# Patient Record
Sex: Male | Born: 2000 | Race: Black or African American | Hispanic: No | Marital: Single | Smoking: Never smoker
Health system: Southern US, Community
[De-identification: ages and names within clinical notes are randomized; demographics above are authoritative.]

## PROBLEM LIST (undated history)

## (undated) DIAGNOSIS — I10 Essential (primary) hypertension: Secondary | ICD-10-CM

## (undated) DIAGNOSIS — F419 Anxiety disorder, unspecified: Secondary | ICD-10-CM

## (undated) HISTORY — DX: Anxiety disorder, unspecified: F41.9

## (undated) HISTORY — DX: Essential (primary) hypertension: I10

---

## 2019-07-13 ENCOUNTER — Emergency Department (HOSPITAL_COMMUNITY): Payer: No Typology Code available for payment source

## 2019-07-13 ENCOUNTER — Inpatient Hospital Stay (HOSPITAL_COMMUNITY): Payer: No Typology Code available for payment source

## 2019-07-13 ENCOUNTER — Other Ambulatory Visit: Payer: Self-pay

## 2019-07-13 ENCOUNTER — Inpatient Hospital Stay (HOSPITAL_COMMUNITY)
Admission: EM | Admit: 2019-07-13 | Discharge: 2019-07-16 | DRG: 982 | Disposition: A | Discharge status: Home / Self Care | Payer: No Typology Code available for payment source | Attending: Physician Assistant | Admitting: Physician Assistant

## 2019-07-13 DIAGNOSIS — S71111A Laceration without foreign body, right thigh, initial encounter: Secondary | ICD-10-CM | POA: Diagnosis present

## 2019-07-13 DIAGNOSIS — R52 Pain, unspecified: Secondary | ICD-10-CM

## 2019-07-13 DIAGNOSIS — S81011A Laceration without foreign body, right knee, initial encounter: Secondary | ICD-10-CM | POA: Diagnosis present

## 2019-07-13 DIAGNOSIS — M264 Malocclusion, unspecified: Secondary | ICD-10-CM | POA: Diagnosis present

## 2019-07-13 DIAGNOSIS — Z20822 Contact with and (suspected) exposure to covid-19: Secondary | ICD-10-CM | POA: Diagnosis present

## 2019-07-13 DIAGNOSIS — Y92410 Unspecified street and highway as the place of occurrence of the external cause: Secondary | ICD-10-CM

## 2019-07-13 DIAGNOSIS — S02621A Fracture of subcondylar process of right mandible, initial encounter for closed fracture: Secondary | ICD-10-CM | POA: Diagnosis present

## 2019-07-13 DIAGNOSIS — S02611A Fracture of condylar process of right mandible, initial encounter for closed fracture: Secondary | ICD-10-CM

## 2019-07-13 DIAGNOSIS — S270XXA Traumatic pneumothorax, initial encounter: Principal | ICD-10-CM | POA: Diagnosis present

## 2019-07-13 DIAGNOSIS — J939 Pneumothorax, unspecified: Secondary | ICD-10-CM

## 2019-07-13 LAB — COMPREHENSIVE METABOLIC PANEL
ALT: 117 U/L — ABNORMAL HIGH (ref 0–44)
AST: 96 U/L — ABNORMAL HIGH (ref 15–41)
Albumin: 4.3 g/dL (ref 3.5–5.0)
Alkaline Phosphatase: 36 U/L — ABNORMAL LOW (ref 38–126)
Anion gap: 11 (ref 5–15)
BUN: 13 mg/dL (ref 6–20)
CO2: 25 mmol/L (ref 22–32)
Calcium: 9.6 mg/dL (ref 8.9–10.3)
Chloride: 104 mmol/L (ref 98–111)
Creatinine, Ser: 1.3 mg/dL — ABNORMAL HIGH (ref 0.61–1.24)
GFR calc Af Amer: >60 mL/min (ref >60)
GFR calc non Af Amer: >60 mL/min (ref >60)
Glucose, Bld: 151 mg/dL — ABNORMAL HIGH (ref 70–99)
Potassium: 4.1 mmol/L (ref 3.5–5.1)
Sodium: 140 mmol/L (ref 135–145)
Total Bilirubin: 1.9 mg/dL — ABNORMAL HIGH (ref 0.3–1.2)
Total Protein: 7.1 g/dL (ref 6.5–8.1)

## 2019-07-13 LAB — RESPIRATORY PANEL BY RT PCR (FLU A&B, COVID)
Influenza A by PCR: NEGATIVE
Influenza B by PCR: NEGATIVE
SARS Coronavirus 2 by RT PCR: NEGATIVE

## 2019-07-13 LAB — I-STAT CHEM 8, ED
BUN: 16 mg/dL (ref 6–20)
Calcium, Ion: 1.24 mmol/L (ref 1.15–1.40)
Chloride: 101 mmol/L (ref 98–111)
Creatinine, Ser: 1.2 mg/dL (ref 0.61–1.24)
Glucose, Bld: 153 mg/dL — ABNORMAL HIGH (ref 70–99)
HCT: 46 % (ref 39.0–52.0)
Hemoglobin: 15.6 g/dL (ref 13.0–17.0)
Potassium: 3.8 mmol/L (ref 3.5–5.1)
Sodium: 138 mmol/L (ref 135–145)
TCO2: 29 mmol/L (ref 22–32)

## 2019-07-13 LAB — CBC
HCT: 46.8 % (ref 39.0–52.0)
Hemoglobin: 15.1 g/dL (ref 13.0–17.0)
MCH: 29.4 pg (ref 26.0–34.0)
MCHC: 32.3 g/dL (ref 30.0–36.0)
MCV: 91.2 fL (ref 80.0–100.0)
Platelets: 187 10*3/uL (ref 150–400)
RBC: 5.13 MIL/uL (ref 4.22–5.81)
RDW: 12.4 % (ref 11.5–15.5)
WBC: 18.9 10*3/uL — ABNORMAL HIGH (ref 4.0–10.5)
nRBC: 0 % (ref 0.0–0.2)

## 2019-07-13 LAB — ETHANOL: Alcohol, Ethyl (B): <10 mg/dL (ref <10)

## 2019-07-13 LAB — PROTIME-INR
INR: 1.1 (ref 0.8–1.2)
Prothrombin Time: 13.7 seconds (ref 11.4–15.2)

## 2019-07-13 LAB — SAMPLE TO BLOOD BANK

## 2019-07-13 LAB — LACTIC ACID, PLASMA
Lactic Acid, Venous: 3.4 mmol/L (ref 0.5–1.9)
Lactic Acid, Venous: 2.1 mmol/L (ref 0.5–1.9)

## 2019-07-13 MED ORDER — LIDOCAINE 2% (20 MG/ML) 5 ML SYRINGE
INTRAMUSCULAR | Status: AC
  Filled 2019-07-13: qty 5

## 2019-07-13 MED ORDER — IBUPROFEN 200 MG PO TABS: 600.0000 mg | ORAL_TABLET | Freq: Four times a day (QID) | ORAL | Status: DC | PRN

## 2019-07-13 MED ORDER — SODIUM CHLORIDE 0.9 % IV BOLUS: 1000.0000 mL | Freq: Once | INTRAVENOUS | Status: DC

## 2019-07-13 MED ORDER — SUCCINYLCHOLINE CHLORIDE 200 MG/10ML IV SOSY
PREFILLED_SYRINGE | INTRAVENOUS | Status: AC
  Filled 2019-07-13: qty 10

## 2019-07-13 MED ORDER — ROCURONIUM BROMIDE 10 MG/ML (PF) SYRINGE
PREFILLED_SYRINGE | INTRAVENOUS | Status: AC
  Filled 2019-07-13: qty 10

## 2019-07-13 MED ORDER — FENTANYL CITRATE (PF) 250 MCG/5ML IJ SOLN
INTRAMUSCULAR | Status: AC
  Filled 2019-07-13: qty 5

## 2019-07-13 MED ORDER — MIDAZOLAM HCL 2 MG/2ML IJ SOLN
INTRAMUSCULAR | Status: AC
  Filled 2019-07-13: qty 2

## 2019-07-13 MED ORDER — ONDANSETRON 4 MG PO TBDP
4.0000 mg | ORAL_TABLET | Freq: Four times a day (QID) | ORAL | Status: DC | PRN
  Administered 2019-07-14: 4 mg via ORAL
  Filled 2019-07-13: qty 1

## 2019-07-13 MED ORDER — BACITRACIN ZINC 500 UNIT/GM EX OINT
TOPICAL_OINTMENT | Freq: Once | CUTANEOUS | Status: AC
  Administered 2019-07-13: 1 via TOPICAL

## 2019-07-13 MED ORDER — HYDROMORPHONE HCL 1 MG/ML IJ SOLN
0.5000 mg | INTRAMUSCULAR | Status: DC | PRN
  Administered 2019-07-13 – 2019-07-14 (×2): 0.5 mg via INTRAVENOUS
  Filled 2019-07-13 (×2): qty 1

## 2019-07-13 MED ORDER — DOCUSATE SODIUM 100 MG PO CAPS
200.0000 mg | ORAL_CAPSULE | Freq: Two times a day (BID) | ORAL | Status: DC
  Administered 2019-07-13: 200 mg via ORAL
  Filled 2019-07-13: qty 2

## 2019-07-13 MED ORDER — LIDOCAINE-EPINEPHRINE (PF) 2 %-1:200000 IJ SOLN
10.0000 mL | Freq: Once | INTRAMUSCULAR | Status: AC
  Administered 2019-07-13: 10 mL
  Filled 2019-07-13: qty 20

## 2019-07-13 MED ORDER — LACTATED RINGERS IV SOLN: INTRAVENOUS | Status: DC

## 2019-07-13 MED ORDER — ONDANSETRON HCL 4 MG/2ML IJ SOLN
INTRAMUSCULAR | Status: AC
  Filled 2019-07-13: qty 2

## 2019-07-13 MED ORDER — ONDANSETRON HCL 4 MG/2ML IJ SOLN
4.0000 mg | Freq: Four times a day (QID) | INTRAMUSCULAR | Status: DC | PRN
  Administered 2019-07-16: 4 mg via INTRAVENOUS
  Filled 2019-07-13: qty 2

## 2019-07-13 MED ORDER — TRAMADOL HCL 50 MG PO TABS: 50.0000 mg | ORAL_TABLET | Freq: Four times a day (QID) | ORAL | Status: DC | PRN

## 2019-07-13 MED ORDER — ACETAMINOPHEN 500 MG PO TABS
1000.0000 mg | ORAL_TABLET | Freq: Four times a day (QID) | ORAL | Status: DC
  Administered 2019-07-13 – 2019-07-14 (×2): 1000 mg via ORAL
  Filled 2019-07-13 (×2): qty 2

## 2019-07-13 MED ORDER — PROPOFOL 10 MG/ML IV BOLUS
INTRAVENOUS | Status: AC
  Filled 2019-07-13: qty 20

## 2019-07-13 MED ORDER — FENTANYL CITRATE (PF) 100 MCG/2ML IJ SOLN
100.0000 ug | Freq: Once | INTRAMUSCULAR | Status: AC
  Administered 2019-07-13: 100 ug via INTRAVENOUS
  Filled 2019-07-13: qty 2

## 2019-07-13 MED ORDER — TETANUS-DIPHTH-ACELL PERTUSSIS 5-2.5-18.5 LF-MCG/0.5 IM SUSP
0.5000 mL | Freq: Once | INTRAMUSCULAR | Status: AC
  Administered 2019-07-13: 0.5 mL via INTRAMUSCULAR
  Filled 2019-07-13: qty 0.5

## 2019-07-13 MED ORDER — ONDANSETRON HCL 4 MG/2ML IJ SOLN
4.0000 mg | Freq: Once | INTRAMUSCULAR | Status: AC
  Administered 2019-07-13: 4 mg via INTRAVENOUS
  Filled 2019-07-13: qty 2

## 2019-07-13 MED ORDER — IOHEXOL 300 MG/ML  SOLN
100.0000 mL | Freq: Once | INTRAMUSCULAR | Status: AC | PRN
  Administered 2019-07-13: 100 mL via INTRAVENOUS

## 2019-07-13 MED ORDER — DEXAMETHASONE SODIUM PHOSPHATE 10 MG/ML IJ SOLN
INTRAMUSCULAR | Status: AC
  Filled 2019-07-13: qty 2
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
## 2019-07-13 NOTE — Anesthesia Preprocedure Evaluation (Addendum)
Anesthesia Evaluation  Patient identified by MRN, date of birth, ID band Patient awake    Reviewed: Allergy & Precautions, NPO status , Patient's Chart, lab work & pertinent test results  History of Anesthesia Complications Negative for: history of anesthetic complications  Airway Mallampati: IV  TM Distance: >3 FB Neck ROM: Full  Mouth opening: Limited Mouth Opening  Dental  (+) Teeth Intact, Dental Advisory Given   Pulmonary  Bilateral occult pneumothoraces, no respiratory distress, on room air   Pulmonary exam normal        Cardiovascular negative cardio ROS Normal cardiovascular exam     Neuro/Psych negative neurological ROS  negative psych ROS   GI/Hepatic negative GI ROS, Neg liver ROS,   Endo/Other  negative endocrine ROS  Renal/GU negative Renal ROS  negative genitourinary   Musculoskeletal negative musculoskeletal ROS (+)   Abdominal   Peds  Hematology negative hematology ROS (+)   Anesthesia Other Findings   Reproductive/Obstetrics                          Anesthesia Physical Anesthesia Plan  ASA: II  Anesthesia Plan: General   Post-op Pain Management:    Induction: Intravenous  PONV Risk Score and Plan: 2 and Ondansetron, Dexamethasone, Treatment may vary due to age or medical condition and Midazolam  Airway Management Planned: Nasal ETT  Additional Equipment: None  Intra-op Plan:   Post-operative Plan: Extubation in OR  Informed Consent: I have reviewed the patients History and Physical, chart, labs and discussed the procedure including the risks, benefits and alternatives for the proposed anesthesia with the patient or authorized representative who has indicated his/her understanding and acceptance.     Dental advisory given  Plan Discussed with:   Anesthesia Plan Comments:         Anesthesia Quick Evaluation
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
## 2019-07-13 NOTE — ED Notes (Signed)
Pt currently in X-ray.

## 2019-07-13 NOTE — ED Notes (Signed)
Pt to xr 
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
## 2019-07-14 ENCOUNTER — Inpatient Hospital Stay (HOSPITAL_COMMUNITY): Payer: No Typology Code available for payment source | Admitting: Anesthesiology

## 2019-07-14 ENCOUNTER — Inpatient Hospital Stay (HOSPITAL_COMMUNITY): Payer: No Typology Code available for payment source

## 2019-07-14 ENCOUNTER — Encounter (HOSPITAL_COMMUNITY): Admission: EM | Disposition: A | Discharge status: Home / Self Care | Payer: Self-pay | Source: Home / Self Care

## 2019-07-14 ENCOUNTER — Encounter (HOSPITAL_COMMUNITY): Payer: Self-pay

## 2019-07-14 HISTORY — PX: CLOSED REDUCTION MANDIBLE WITH MANDIBULOMA: SHX5313

## 2019-07-14 HISTORY — PX: CHEST TUBE INSERTION: SHX231

## 2019-07-14 LAB — CBC
HCT: 43.2 % (ref 39.0–52.0)
Hemoglobin: 14.1 g/dL (ref 13.0–17.0)
MCH: 29.9 pg (ref 26.0–34.0)
MCHC: 32.6 g/dL (ref 30.0–36.0)
MCV: 91.5 fL (ref 80.0–100.0)
Platelets: 177 10*3/uL (ref 150–400)
RBC: 4.72 MIL/uL (ref 4.22–5.81)
RDW: 12.5 % (ref 11.5–15.5)
WBC: 12.6 10*3/uL — ABNORMAL HIGH (ref 4.0–10.5)
nRBC: 0 % (ref 0.0–0.2)

## 2019-07-14 SURGERY — CLOSED REDUCTION, MANDIBLE, WITH ARCH BAR APPLICATION AND INTERMAXILLARY FIXATION
Anesthesia: General | Site: Mouth | Laterality: Right

## 2019-07-14 MED ORDER — FENTANYL CITRATE (PF) 250 MCG/5ML IJ SOLN
INTRAMUSCULAR | Status: AC
  Filled 2019-07-14: qty 5

## 2019-07-14 MED ORDER — LIDOCAINE 2% (20 MG/ML) 5 ML SYRINGE
INTRAMUSCULAR | Status: AC
  Filled 2019-07-14: qty 5

## 2019-07-14 MED ORDER — LIDOCAINE 2% (20 MG/ML) 5 ML SYRINGE
INTRAMUSCULAR | Status: DC | PRN
  Administered 2019-07-14: 100 mg via INTRAVENOUS

## 2019-07-14 MED ORDER — SUGAMMADEX SODIUM 200 MG/2ML IV SOLN
INTRAVENOUS | Status: DC | PRN
  Administered 2019-07-14: 200 mg via INTRAVENOUS

## 2019-07-14 MED ORDER — DEXMEDETOMIDINE HCL 200 MCG/2ML IV SOLN
INTRAVENOUS | Status: DC | PRN
  Administered 2019-07-14: 8 ug via INTRAVENOUS

## 2019-07-14 MED ORDER — DEXMEDETOMIDINE HCL IN NACL 200 MCG/50ML IV SOLN
INTRAVENOUS | Status: AC
  Filled 2019-07-14: qty 50

## 2019-07-14 MED ORDER — ONDANSETRON HCL 4 MG/2ML IJ SOLN
INTRAMUSCULAR | Status: AC
  Filled 2019-07-14: qty 2

## 2019-07-14 MED ORDER — OXYCODONE HCL 5 MG PO TABS: 5.0000 mg | ORAL_TABLET | Freq: Once | ORAL | Status: DC | PRN

## 2019-07-14 MED ORDER — ROCURONIUM BROMIDE 10 MG/ML (PF) SYRINGE
PREFILLED_SYRINGE | INTRAVENOUS | Status: DC | PRN
  Administered 2019-07-14: 70 mg via INTRAVENOUS

## 2019-07-14 MED ORDER — HYDRALAZINE HCL 20 MG/ML IJ SOLN
5.0000 mg | Freq: Once | INTRAMUSCULAR | Status: AC
  Administered 2019-07-14: 5 mg via INTRAVENOUS

## 2019-07-14 MED ORDER — FENTANYL CITRATE (PF) 100 MCG/2ML IJ SOLN
25.0000 ug | INTRAMUSCULAR | Status: DC | PRN
  Administered 2019-07-14: 25 ug via INTRAVENOUS

## 2019-07-14 MED ORDER — MIDAZOLAM HCL 2 MG/2ML IJ SOLN
INTRAMUSCULAR | Status: AC
  Filled 2019-07-14: qty 2

## 2019-07-14 MED ORDER — LABETALOL HCL 5 MG/ML IV SOLN
10.0000 mg | Freq: Once | INTRAVENOUS | Status: AC
  Administered 2019-07-14: 10 mg via INTRAVENOUS

## 2019-07-14 MED ORDER — FENTANYL CITRATE (PF) 250 MCG/5ML IJ SOLN
INTRAMUSCULAR | Status: DC | PRN
  Administered 2019-07-14 (×2): 50 ug via INTRAVENOUS
  Administered 2019-07-14: 100 ug via INTRAVENOUS
  Administered 2019-07-14: 50 ug via INTRAVENOUS

## 2019-07-14 MED ORDER — HYDROMORPHONE HCL 1 MG/ML IJ SOLN
INTRAMUSCULAR | Status: AC
  Filled 2019-07-14: qty 0.5

## 2019-07-14 MED ORDER — IBUPROFEN 100 MG/5ML PO SUSP
600.0000 mg | Freq: Three times a day (TID) | ORAL | Status: DC
  Administered 2019-07-14 – 2019-07-16 (×6): 600 mg via ORAL
  Filled 2019-07-14 (×8): qty 30

## 2019-07-14 MED ORDER — CLINDAMYCIN PALMITATE HCL 75 MG/5ML PO SOLR
300.0000 mg | Freq: Three times a day (TID) | ORAL | Status: DC
  Filled 2019-07-14 (×2): qty 20

## 2019-07-14 MED ORDER — OXYMETAZOLINE HCL 0.05 % NA SOLN
NASAL | Status: DC | PRN
Start: 2019-07-14 — End: 2019-07-14
  Administered 2019-07-14: 2 via NASAL

## 2019-07-14 MED ORDER — OXYMETAZOLINE HCL 0.05 % NA SOLN
NASAL | Status: AC
  Filled 2019-07-14: qty 30

## 2019-07-14 MED ORDER — HYDROMORPHONE HCL 1 MG/ML IJ SOLN
INTRAMUSCULAR | Status: DC | PRN
  Administered 2019-07-14: .5 mg via INTRAVENOUS

## 2019-07-14 MED ORDER — FENTANYL CITRATE (PF) 100 MCG/2ML IJ SOLN
INTRAMUSCULAR | Status: AC
  Filled 2019-07-14: qty 2

## 2019-07-14 MED ORDER — OXYCODONE HCL 5 MG/5ML PO SOLN
5.0000 mg | ORAL | Status: DC | PRN
  Administered 2019-07-14 – 2019-07-16 (×5): 10 mg via ORAL
  Filled 2019-07-14 (×6): qty 10

## 2019-07-14 MED ORDER — ONDANSETRON HCL 4 MG/2ML IJ SOLN: 4.0000 mg | Freq: Once | INTRAMUSCULAR | Status: DC | PRN

## 2019-07-14 MED ORDER — DEXAMETHASONE SODIUM PHOSPHATE 10 MG/ML IJ SOLN
INTRAMUSCULAR | Status: DC | PRN
  Administered 2019-07-14: 10 mg via INTRAVENOUS

## 2019-07-14 MED ORDER — HYDRALAZINE HCL 20 MG/ML IJ SOLN
INTRAMUSCULAR | Status: AC
  Filled 2019-07-14: qty 1

## 2019-07-14 MED ORDER — LACTATED RINGERS IV SOLN: INTRAVENOUS | Status: DC

## 2019-07-14 MED ORDER — CLINDAMYCIN PHOSPHATE 900 MG/50ML IV SOLN
INTRAVENOUS | Status: DC | PRN
Start: 2019-07-14 — End: 2019-07-14
  Administered 2019-07-14: 900 mg via INTRAVENOUS

## 2019-07-14 MED ORDER — ACETAMINOPHEN 160 MG/5ML PO SOLN
650.0000 mg | Freq: Four times a day (QID) | ORAL | Status: DC
  Administered 2019-07-14 – 2019-07-16 (×8): 650 mg via ORAL
  Filled 2019-07-14 (×8): qty 20.3

## 2019-07-14 MED ORDER — DEXAMETHASONE SODIUM PHOSPHATE 10 MG/ML IJ SOLN
INTRAMUSCULAR | Status: AC
  Filled 2019-07-14: qty 1

## 2019-07-14 MED ORDER — BACITRACIN ZINC 500 UNIT/GM EX OINT
TOPICAL_OINTMENT | Freq: Every day | CUTANEOUS | Status: DC
  Filled 2019-07-14: qty 28.4

## 2019-07-14 MED ORDER — LIDOCAINE-EPINEPHRINE 1 %-1:100000 IJ SOLN
INTRAMUSCULAR | Status: DC | PRN
  Administered 2019-07-14: 1 mL

## 2019-07-14 MED ORDER — CLINDAMYCIN PALMITATE HCL 75 MG/5ML PO SOLR
150.0000 mg | Freq: Three times a day (TID) | ORAL | Status: DC
  Filled 2019-07-14 (×2): qty 10

## 2019-07-14 MED ORDER — OXYCODONE HCL 5 MG/5ML PO SOLN: 5.0000 mg | Freq: Once | ORAL | Status: DC | PRN

## 2019-07-14 MED ORDER — LIDOCAINE-EPINEPHRINE 1 %-1:100000 IJ SOLN
INTRAMUSCULAR | Status: AC
  Filled 2019-07-14: qty 1

## 2019-07-14 MED ORDER — LABETALOL HCL 5 MG/ML IV SOLN
INTRAVENOUS | Status: AC
  Filled 2019-07-14: qty 4

## 2019-07-14 MED ORDER — HYDROMORPHONE HCL 1 MG/ML IJ SOLN: 0.2500 mg | INTRAMUSCULAR | Status: DC | PRN

## 2019-07-14 MED ORDER — PROPOFOL 10 MG/ML IV BOLUS
INTRAVENOUS | Status: DC | PRN
  Administered 2019-07-14: 200 mg via INTRAVENOUS

## 2019-07-14 MED ORDER — MIDAZOLAM HCL 5 MG/5ML IJ SOLN
INTRAMUSCULAR | Status: DC | PRN
  Administered 2019-07-14: 2 mg via INTRAVENOUS

## 2019-07-14 MED ORDER — CLINDAMYCIN PALMITATE HCL 75 MG/5ML PO SOLR
450.0000 mg | Freq: Three times a day (TID) | ORAL | Status: DC
  Administered 2019-07-14: 450 mg via ORAL
  Filled 2019-07-14 (×2): qty 30

## 2019-07-14 MED ORDER — ONDANSETRON HCL 4 MG/2ML IJ SOLN
INTRAMUSCULAR | Status: DC | PRN
  Administered 2019-07-14: 4 mg via INTRAVENOUS

## 2019-07-14 MED ORDER — LACTATED RINGERS IV SOLN: INTRAVENOUS | Status: DC | PRN

## 2019-07-14 MED ORDER — PROPOFOL 10 MG/ML IV BOLUS
INTRAVENOUS | Status: AC
  Filled 2019-07-14: qty 40

## 2019-07-14 MED ORDER — CLINDAMYCIN PHOSPHATE 900 MG/50ML IV SOLN
INTRAVENOUS | Status: AC
  Filled 2019-07-14: qty 50

## 2019-07-14 SURGICAL SUPPLY — 34 items
BAG DECANTER FOR FLEXI CONT (MISCELLANEOUS) IMPLANT
BLADE SURG 15 STRL LF DISP TIS (BLADE) IMPLANT
BLADE SURG 15 STRL SS (BLADE) ×2
CANISTER SUCT 3000ML PPV (MISCELLANEOUS) ×4 IMPLANT
CLEANER TIP ELECTROSURG 2X2 (MISCELLANEOUS) ×4 IMPLANT
COVER WAND RF STERILE (DRAPES) ×4 IMPLANT
DRAPE HALF SHEET 40X57 (DRAPES) IMPLANT
ELECT COATED BLADE 2.86 ST (ELECTRODE) IMPLANT
ELECT NDL BLADE 2-5/6 (NEEDLE) IMPLANT
ELECT NEEDLE BLADE 2-5/6 (NEEDLE) IMPLANT
ELECT REM PT RETURN 9FT ADLT (ELECTROSURGICAL) ×4
ELECTRODE REM PT RTRN 9FT ADLT (ELECTROSURGICAL) ×2 IMPLANT
GAUZE SPONGE 4X4 12PLY STRL (GAUZE/BANDAGES/DRESSINGS) ×2 IMPLANT
GAUZE XEROFORM 5X9 LF (GAUZE/BANDAGES/DRESSINGS) ×2 IMPLANT
GLOVE ECLIPSE 7.5 STRL STRAW (GLOVE) ×6 IMPLANT
GOWN STRL REUS W/ TWL LRG LVL3 (GOWN DISPOSABLE) ×4 IMPLANT
GOWN STRL REUS W/TWL LRG LVL3 (GOWN DISPOSABLE) ×4
KIT BASIN OR (CUSTOM PROCEDURE TRAY) ×4 IMPLANT
KIT TURNOVER KIT B (KITS) ×4 IMPLANT
NDL HYPO 25GX1X1/2 BEV (NEEDLE) IMPLANT
NEEDLE HYPO 25GX1X1/2 BEV (NEEDLE) IMPLANT
NS IRRIG 1000ML POUR BTL (IV SOLUTION) ×4 IMPLANT
PAD ARMBOARD 7.5X6 YLW CONV (MISCELLANEOUS) ×8 IMPLANT
PENCIL BUTTON HOLSTER BLD 10FT (ELECTRODE) ×4 IMPLANT
SCISSORS WIRE ANG 4 3/4 DISP (INSTRUMENTS) IMPLANT
SCREW UPPER FACE 2.0X8MM (Screw) ×8 IMPLANT
SUT PLAIN 6 0 TG1408 (SUTURE) ×2 IMPLANT
SUT PROLENE 5 0 C1 (SUTURE) ×2 IMPLANT
SUT VIC AB 3-0 FS2 27 (SUTURE) ×2 IMPLANT
TAPE PAPER 3X10 WHT MICROPORE (GAUZE/BANDAGES/DRESSINGS) ×2 IMPLANT
TOWEL GREEN STERILE FF (TOWEL DISPOSABLE) ×4 IMPLANT
TRAY ENT MC OR (CUSTOM PROCEDURE TRAY) ×4 IMPLANT
TRAY WAYNE PNEUMOTHORAX 14X18 (TRAY / TRAY PROCEDURE) ×2 IMPLANT
WATER STERILE IRR 1000ML POUR (IV SOLUTION) ×4 IMPLANT

## 2019-07-14 NOTE — Progress Notes (Signed)
Patient in recovery. Patient's jaw is wired shut, patient able to speak clearly and communicate needs. Wire cutters at the bedside.

## 2019-07-14 NOTE — Anesthesia Postprocedure Evaluation (Signed)
Anesthesia Post Note  Patient: Justin Chen  Procedure(s) Performed: CLOSED REDUCTION MANDIBLE WITH MANDIBULOMAXILLARY FUSION (N/A Mouth) Chest Tube Insertion (Right Chest)     Patient location during evaluation: PACU Anesthesia Type: General Level of consciousness: awake and alert Pain management: pain level controlled Vital Signs Assessment: post-procedure vital signs reviewed and stable Respiratory status: spontaneous breathing, nonlabored ventilation, respiratory function stable and patient connected to nasal cannula oxygen Cardiovascular status: blood pressure returned to baseline and stable Postop Assessment: no apparent nausea or vomiting Anesthetic complications: no    Last Vitals:  Vitals:   07/14/19 1500 07/14/19 1530  BP: (!) 164/100 (!) 163/109  Pulse: 90 91  Resp: 15 18  Temp:    SpO2: 98% 99%    Last Pain:  Vitals:   07/14/19 1545  TempSrc:   PainSc: 6                  Cecile Hearing
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
## 2019-07-14 NOTE — Transfer of Care (Signed)
Immediate Anesthesia Transfer of Care Note  Patient: Justin Chen  Procedure(s) Performed: CLOSED REDUCTION MANDIBLE WITH MANDIBULOMAXILLARY FUSION (N/A Mouth) Chest Tube Insertion (Right Chest)  Patient Location: PACU  Anesthesia Type:General  Level of Consciousness: awake and alert   Airway & Oxygen Therapy: Patient Spontanous Breathing and Patient connected to face mask oxygen  Post-op Assessment: Report given to RN, Post -op Vital signs reviewed and stable and Patient moving all extremities X 4  Post vital signs: Reviewed and stable  Last Vitals:  Vitals Value Taken Time  BP 170/122 07/14/19 1258  Temp    Pulse 90 07/14/19 1258  Resp 12 07/14/19 1258  SpO2 100 % 07/14/19 1258  Vitals shown include unvalidated device data.  Last Pain:  Vitals:   07/14/19 1030  TempSrc: Oral  PainSc:       Patients Stated Pain Goal: 0 (07/13/19 1401)  Complications: No apparent anesthesia complications
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
## 2019-07-14 NOTE — ED Notes (Signed)
Pt has been sleeping all night and family is still at bedside. No changes in patient status at this time.

## 2019-07-14 NOTE — Anesthesia Procedure Notes (Signed)
Procedure Name: Intubation Date/Time: 07/14/2019 11:50 AM Performed by: Nils Pyle, CRNA Pre-anesthesia Checklist: Patient identified, Emergency Drugs available, Suction available and Patient being monitored Patient Re-evaluated:Patient Re-evaluated prior to induction Oxygen Delivery Method: Circle System Utilized Preoxygenation: Pre-oxygenation with 100% oxygen Induction Type: IV induction Ventilation: Mask ventilation without difficulty Laryngoscope Size: Glidescope and 4 Grade View: Grade I Nasal Tubes: Right, Nasal Rae and Nasal prep performed Tube size: 7.5 mm Number of attempts: 1 Airway Equipment and Method: Stylet and Oral airway Placement Confirmation: ETT inserted through vocal cords under direct vision,  positive ETCO2 and breath sounds checked- equal and bilateral Tube secured with: Tape Dental Injury: Teeth and Oropharynx as per pre-operative assessment
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
## 2019-07-15 ENCOUNTER — Encounter: Payer: Self-pay | Admitting: *Deleted

## 2019-07-15 ENCOUNTER — Inpatient Hospital Stay (HOSPITAL_COMMUNITY): Payer: No Typology Code available for payment source

## 2019-07-15 LAB — BASIC METABOLIC PANEL
Anion gap: 11 (ref 5–15)
BUN: 10 mg/dL (ref 6–20)
CO2: 27 mmol/L (ref 22–32)
Calcium: 9.4 mg/dL (ref 8.9–10.3)
Chloride: 100 mmol/L (ref 98–111)
Creatinine, Ser: 1.09 mg/dL (ref 0.61–1.24)
GFR calc Af Amer: >60 mL/min (ref >60)
GFR calc non Af Amer: >60 mL/min (ref >60)
Glucose, Bld: 89 mg/dL (ref 70–99)
Potassium: 3.7 mmol/L (ref 3.5–5.1)
Sodium: 138 mmol/L (ref 135–145)

## 2019-07-15 LAB — CBC
HCT: 37.9 % — ABNORMAL LOW (ref 39.0–52.0)
Hemoglobin: 12.5 g/dL — ABNORMAL LOW (ref 13.0–17.0)
MCH: 30 pg (ref 26.0–34.0)
MCHC: 33 g/dL (ref 30.0–36.0)
MCV: 90.9 fL (ref 80.0–100.0)
Platelets: 155 10*3/uL (ref 150–400)
RBC: 4.17 MIL/uL — ABNORMAL LOW (ref 4.22–5.81)
RDW: 12.6 % (ref 11.5–15.5)
WBC: 9.1 10*3/uL (ref 4.0–10.5)
nRBC: 0 % (ref 0.0–0.2)

## 2019-07-15 LAB — HIV ANTIBODY (ROUTINE TESTING W REFLEX): HIV Screen 4th Generation wRfx: NONREACTIVE

## 2019-07-15 MED ORDER — CLINDAMYCIN PALMITATE HCL 75 MG/5ML PO SOLR
450.0000 mg | Freq: Three times a day (TID) | ORAL | Status: DC
  Administered 2019-07-15 – 2019-07-16 (×4): 450 mg via ORAL
  Filled 2019-07-15 (×6): qty 30

## 2019-07-15 MED ORDER — METHOCARBAMOL 1000 MG/10ML IJ SOLN
500.0000 mg | Freq: Three times a day (TID) | INTRAVENOUS | Status: DC | PRN
  Filled 2019-07-15: qty 5

## 2019-07-15 MED ORDER — CLINDAMYCIN PALMITATE HCL 75 MG/5ML PO SOLR: 150.0000 mg | Freq: Once | ORAL | Status: DC

## 2019-07-15 MED ORDER — BUSPIRONE HCL 5 MG PO TABS: 5.0000 mg | ORAL_TABLET | Freq: Three times a day (TID) | ORAL | Status: DC | PRN

## 2019-07-15 MED ORDER — CLINDAMYCIN PALMITATE HCL 75 MG/5ML PO SOLR: 300.0000 mg | Freq: Once | ORAL | Status: DC

## 2019-07-15 MED ORDER — ENOXAPARIN SODIUM 30 MG/0.3ML ~~LOC~~ SOLN
30.0000 mg | Freq: Two times a day (BID) | SUBCUTANEOUS | Status: DC
  Administered 2019-07-15 – 2019-07-16 (×3): 30 mg via SUBCUTANEOUS
  Filled 2019-07-15 (×3): qty 0.3

## 2019-07-15 MED ORDER — CLINDAMYCIN PALMITATE HCL 75 MG/5ML PO SOLR
450.0000 mg | Freq: Once | ORAL | Status: AC
  Administered 2019-07-15: 450 mg via ORAL

## 2019-07-15 MED ORDER — ESCITALOPRAM OXALATE 10 MG PO TABS
10.0000 mg | ORAL_TABLET | Freq: Every evening | ORAL | Status: DC
  Administered 2019-07-15: 10 mg via ORAL
  Filled 2019-07-15: qty 1
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
## 2019-07-15 NOTE — Discharge Instructions (Signed)
Jaw Fracture Eating Plan A break (fracture) of the jaw bone often needs surgery for treatment. After surgery, you will need to eat foods that can be blended so that they can be sipped from a straw or given through a syringe. Work with a diet and nutrition specialist (dietitian) to create an eating plan that helps you get the nutrients you need in order to heal and stay healthy. What are tips for following this plan? General guidelines  All foods in this plan must be blended. Avoid nuts, seeds, skins, peels, bones, or any foods that cannot be blended to the right consistency.  Ask your health care provider about taking a liquid multivitamin to make sure that you get all the vitamins and minerals you need. Cooking   Before blending, remove any skins, seeds, or peels from food.  Cook meats and vegetables until tender.  Cut foods into small pieces and mix with a small amount of liquid in a food processor or blender. Continue to add liquid until the food becomes thin enough to sip through a straw.  Add liquids such as juice, milk, cream, broth, gravy, or vegetable juice to help add flavor to foods.  Heat foods after they have been blended, not before. This reduces the amount of foam created from blending.  If you need to increase calories in food: ? Add protein powder or powdered milk to foods. ? Cook with fats, such as margarine (without trans fat), sour cream, cream cheese, cream, or nut butters. ? Prepare foods with sweeteners, such as honey, ice cream, blackstrap molasses, or sugar. Meal planning  Eat at least three meals and three snacks daily. It is important to make sure that you get enough calories and protein to prevent weight loss and help your body heal, especially after surgery.  Eat a variety of foods from each food group every day, including fruits and vegetables, protein, whole grains, dairy, and healthy fats.  If your teeth and mouth are sensitive to extreme temperatures,  heat or cool your foods to lukewarm temperatures. What foods are recommended? The items listed may not be a complete list. Talk with your dietitian about what dietary choices are best for you. Grains Hot cereals, such as oatmeal, grits, ground wheat cereals, and polenta. Rice and pasta. Couscous. Vegetables All cooked or canned vegetables, without seeds and skins. Vegetable juices. Cooked potatoes, without skins. Fruits Any cooked or canned fruits, without seeds and skins. Fresh, peeled soft fruits, such as bananas and peaches, that can be blended until smooth. All fruit juices, without seeds and skins. Meat and other protein foods Soft-boiled eggs, scrambled eggs, powdered eggs, pasteurized egg mixtures, and custard. Ground meats, such as hamburger, [place], sausage, and meatloaf. Tender, well-cooked meat, poultry, and fish, prepared without bones or skin. Soft soy foods, such as tofu. Smooth nut butters. Liquid egg substitutes. Dairy Milk. Cheese. Yogurt. Cottage cheese. Pudding. Beverages Coffee (regular or decaffeinated), tea, and mineral water. Liquid supplements that have protein and calories. Fats and oils Any oils. Melted margarine or butter. Ghee. Sour cream. Cream cheese. Avocado. Seasoning and other foods All seasonings and condiments that blend well. Ground spices. Finely ground seeds and nuts. Mustard or any smooth condiment. Summary  Foods in this plan need to be prepared so that they can be sipped from a straw or given through a syringe. Try to have at least three meals and three snacks daily.  Avoid nuts, seeds, skins, peels, bones, or any foods that cannot be blended to  the right consistency. Make sure you eat a variety of foods from each food group every day.  Include a liquid multivitamin in your plan as told by your health care provider or dietitian. This information is not intended to replace advice given to you by your health care provider. Make sure you discuss any  questions you have with your health care provider. Document Revised: 06/14/2018 Document Reviewed: 05/30/2016 Elsevier Patient Education  [employer].   Pneumothorax A pneumothorax is commonly called a collapsed lung. It is a condition in which air leaks from a lung and builds up between the thin layer of tissue that covers the lungs (visceral pleura) and the interior wall of the chest cavity (parietal pleura). The air gets trapped outside the lung, between the lung and the chest wall (pleural space). The air takes up space and prevents the lung from fully expanding. This condition sometimes occurs suddenly with no apparent cause. The buildup of air may be small or large. A small pneumothorax may go away on its own. A large pneumothorax will require treatment and hospitalization. What are the causes? This condition may be caused by:  Trauma and injury to the chest wall.  Surgery and other medical procedures.  A complication of an underlying lung problem, especially chronic obstructive pulmonary disease (COPD) or emphysema. Sometimes the cause of this condition is not known. What increases the risk? You are more likely to develop this condition if:  You have an underlying lung problem.  You smoke.  You are 68-75 years old, male, tall, and underweight.  You have a personal or family history of pneumothorax.  You have an eating disorder (anorexia nervosa). This condition can also happen quickly, even in people with no history of lung problems. What are the signs or symptoms? Sometimes a pneumothorax will have no symptoms. When symptoms are present, they can include:  Chest pain.  Shortness of breath.  Increased rate of breathing.  Bluish color to your lips or skin (cyanosis). How is this diagnosed? This condition may be diagnosed by:  A medical history and physical exam.  A chest X-ray, chest CT scan, or ultrasound. How is this treated? Treatment depends on how  severe your condition is. The goal of treatment is to remove the extra air and allow your lung to expand back to its normal size.  For a small pneumothorax: ? No treatment may be needed. ? Extra oxygen is sometimes used to make it go away more quickly.  For a large pneumothorax or a pneumothorax that is causing symptoms, a procedure is done to drain the air from your lungs. To do this, a health care provider may use: ? A needle with a syringe. This is used to suck air from a pleural space where no additional leakage is taking place. ? A chest tube. This is used to suck air where there is ongoing leakage into the pleural space. The chest tube may need to remain in place for several days until the air leak has healed.  In more severe cases, surgery may be needed to repair the damage that is causing the leak.  If you have multiple pneumothorax episodes or have an air leak that will not heal, a procedure called a pleurodesis may be done. A medicine is placed in the pleural space to irritate the tissues around the lung so that the lung will stick to the chest wall, seal any leaks, and stop any buildup of air in that space. If you  have an underlying lung problem, severe symptoms, or a large pneumothorax you will usually need to stay in the hospital. Follow these instructions at home: Lifestyle  Do not use any products that contain nicotine or tobacco, such as cigarettes and e-cigarettes. These are major risk factors in pneumothorax. If you need help quitting, ask your health care provider.  Do not lift anything that is heavier than 10 lb (4.5 kg), or the limit that your health care provider tells you, until he or she says that it is safe.  Avoid activities that take a lot of effort (strenuous) for as long as told by your health care provider.  Return to your normal activities as told by your health care provider. Ask your health care provider what activities are safe for you.  Do not fly in an  airplane or scuba dive until your health care provider says it is okay. General instructions  Take over-the-counter and prescription medicines only as told by your health care provider.  If a cough or pain makes it difficult for you to sleep at night, try sleeping in a semi-upright position in a recliner or by using 2 or 3 pillows.  If you had a chest tube and it was removed, ask your health care provider when you can remove the bandage (dressing). While the dressing is in place, do not allow it to get wet.  Keep all follow-up visits as told by your health care provider. This is important. Contact a health care provider if:  You cough up thick mucus (sputum) that is yellow or green in color.  You were treated with a chest tube, and you have redness, increasing pain, or discharge at the site where it was placed. Get help right away if:  You have increasing chest pain or shortness of breath.  You have a cough that will not go away.  You begin coughing up blood.  You have pain that is getting worse or is not controlled with medicines.  The site where your chest tube was located opens up.  You feel air coming out of the site where the chest tube was placed.  You have a fever or persistent symptoms for more than 2-3 days.  You have a fever and your symptoms suddenly get worse. These symptoms may represent a serious problem that is an emergency. Do not wait to see if the symptoms will go away. Get medical help right away. Call your local emergency services (911 in the [place] Do not drive yourself to the hospital. Summary  A pneumothorax, commonly called a collapsed lung, is a condition in which air leaks from a lung and gets trapped between the lung and the chest wall (pleural space).  The buildup of air may be small or large. A small pneumothorax may go away on its own. A large pneumothorax will require treatment and hospitalization.  Treatment for this condition depends on how  severe the pneumothorax is. The goal of treatment is to remove the extra air and allow the lung to expand back to its normal size. This information is not intended to replace advice given to you by your health care provider. Make sure you discuss any questions you have with your health care provider. Document Revised: 02/02/2017 Document Reviewed: 01/29/2017 Elsevier Patient Education  2020 [employer].

## 2019-07-15 NOTE — Progress Notes (Signed)
The chaplain responded to a consult for prayer. The patient stated that they did not request a visit from a chaplain. The chaplain is available for follow-up.  Lavone Neri Chaplain Resident For questions concerning this note please contact me by pager [phone]

## 2019-07-15 NOTE — Social Work (Signed)
CSW met with pt at bedside. CSW introduced self and explained her role. CSW completed sbirt with pt.  Pt scored a 0 on the sbirt scale. Pt denied alcohol use. Pt denied substance use. Pt did not need resources at this time.  Justin Chen, Latanya Presser, [place] Social Worker [phone]

## 2019-07-16 ENCOUNTER — Inpatient Hospital Stay (HOSPITAL_COMMUNITY): Payer: No Typology Code available for payment source

## 2019-07-16 MED ORDER — MAGNESIUM CITRATE PO SOLN
1.0000 | Freq: Once | ORAL | Status: AC
  Administered 2019-07-16: 1 via ORAL
  Filled 2019-07-16: qty 296

## 2019-07-16 MED ORDER — ACETAMINOPHEN 160 MG/5ML PO SOLN: 650.0000 mg | Freq: Four times a day (QID) | ORAL | Status: DC | PRN

## 2019-07-16 MED ORDER — OXYCODONE HCL 5 MG/5ML PO SOLN: 5.0000 mg | Freq: Four times a day (QID) | ORAL | 0 refills | Status: DC | PRN

## 2019-07-16 MED ORDER — IBUPROFEN 100 MG/5ML PO SUSP: 600.0000 mg | Freq: Three times a day (TID) | ORAL | Status: DC | PRN

## 2019-07-16 MED ORDER — CLINDAMYCIN PALMITATE HCL 75 MG/5ML PO SOLR: 450.0000 mg | Freq: Three times a day (TID) | ORAL | 0 refills | Status: AC

## 2019-07-16 NOTE — Progress Notes (Signed)
OT Cancellation Note  Patient Details Name: Justin Chen MRN: [id number] DOB: [birth year]   Cancelled Treatment:    Reason Eval/Treat Not Completed: OT screened, no needs identified, will sign off(Met with pt and family, no OT needs or concerns identified. Pt is completing ADLs independently, no cognitive changes noted. OT will sign off, thank you.)  Zenovia Jarred, MSOT, OTR/L [facility] Office Number: [phone] Pager: [phone]  Zenovia Jarred 07/16/2019, 2:51 PM
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
## 2019-07-16 NOTE — Progress Notes (Signed)
RN removed chest tube per MD order. Pt tolerated well.

## 2019-07-16 NOTE — Progress Notes (Signed)
RN noted a slight ST elevation on ECG monitor. RN checked pt and pt was asymptomatic. 12 lead EKG was performed and placed in chart. MD was notified. Will continue to monitor.
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
## 2019-07-16 NOTE — Progress Notes (Signed)
Pt and family given D/C education and all questions answered. No equipment to deliver. Prescriptions delivered to Pt's room prior to D/C. IV removed. Pt taken to car with all belongings.

## 2019-08-01 ENCOUNTER — Other Ambulatory Visit: Payer: Self-pay | Admitting: Otolaryngology

## 2019-08-11 ENCOUNTER — Other Ambulatory Visit: Payer: Self-pay

## 2019-08-11 ENCOUNTER — Encounter (HOSPITAL_BASED_OUTPATIENT_CLINIC_OR_DEPARTMENT_OTHER): Payer: Self-pay | Admitting: Otolaryngology

## 2019-08-15 ENCOUNTER — Other Ambulatory Visit (HOSPITAL_COMMUNITY): Payer: No Typology Code available for payment source | Attending: Otolaryngology

## 2019-08-15 ENCOUNTER — Ambulatory Visit: Payer: Medicaid Other | Attending: Internal Medicine

## 2019-08-15 DIAGNOSIS — Z20822 Contact with and (suspected) exposure to covid-19: Secondary | ICD-10-CM

## 2019-08-16 LAB — SARS-COV-2, NAA 2 DAY TAT

## 2019-08-16 LAB — NOVEL CORONAVIRUS, NAA: SARS-CoV-2, NAA: NOT DETECTED

## 2019-08-19 ENCOUNTER — Encounter (HOSPITAL_BASED_OUTPATIENT_CLINIC_OR_DEPARTMENT_OTHER): Payer: Self-pay | Admitting: Otolaryngology

## 2019-08-19 ENCOUNTER — Ambulatory Visit (HOSPITAL_BASED_OUTPATIENT_CLINIC_OR_DEPARTMENT_OTHER): Payer: BC Managed Care – PPO | Admitting: Certified Registered Nurse Anesthetist

## 2019-08-19 ENCOUNTER — Ambulatory Visit (HOSPITAL_BASED_OUTPATIENT_CLINIC_OR_DEPARTMENT_OTHER)
Admission: RE | Admit: 2019-08-19 | Discharge: 2019-08-19 | Disposition: A | Discharge status: Home / Self Care | Payer: BC Managed Care – PPO | Attending: Otolaryngology | Admitting: Otolaryngology

## 2019-08-19 ENCOUNTER — Other Ambulatory Visit: Payer: Self-pay

## 2019-08-19 ENCOUNTER — Encounter (HOSPITAL_BASED_OUTPATIENT_CLINIC_OR_DEPARTMENT_OTHER)
Admission: RE | Disposition: A | Discharge status: Home / Self Care | Payer: Self-pay | Source: Home / Self Care | Attending: Otolaryngology

## 2019-08-19 DIAGNOSIS — Z472 Encounter for removal of internal fixation device: Secondary | ICD-10-CM | POA: Insufficient documentation

## 2019-08-19 HISTORY — PX: MANDIBULAR HARDWARE REMOVAL: SHX5205

## 2019-08-19 SURGERY — REMOVAL, HARDWARE, MANDIBLE
Anesthesia: Monitor Anesthesia Care | Site: Mouth | Laterality: Bilateral

## 2019-08-19 MED ORDER — HYDROMORPHONE HCL 1 MG/ML IJ SOLN
INTRAMUSCULAR | Status: AC
  Filled 2019-08-19: qty 0.5

## 2019-08-19 MED ORDER — DEXAMETHASONE SODIUM PHOSPHATE 10 MG/ML IJ SOLN
INTRAMUSCULAR | Status: AC
  Filled 2019-08-19: qty 1

## 2019-08-19 MED ORDER — PROPOFOL 10 MG/ML IV BOLUS
INTRAVENOUS | Status: AC
  Filled 2019-08-19: qty 20

## 2019-08-19 MED ORDER — FENTANYL CITRATE (PF) 100 MCG/2ML IJ SOLN
INTRAMUSCULAR | Status: AC
  Filled 2019-08-19: qty 2

## 2019-08-19 MED ORDER — ONDANSETRON HCL 4 MG/2ML IJ SOLN
INTRAMUSCULAR | Status: DC | PRN
  Administered 2019-08-19: 4 mg via INTRAVENOUS

## 2019-08-19 MED ORDER — OXYCODONE HCL 5 MG PO TABS: 5.0000 mg | ORAL_TABLET | Freq: Once | ORAL | Status: DC | PRN

## 2019-08-19 MED ORDER — CEFAZOLIN SODIUM-DEXTROSE 2-3 GM-%(50ML) IV SOLR
INTRAVENOUS | Status: DC | PRN
Start: 2019-08-19 — End: 2019-08-19
  Administered 2019-08-19: 2 g via INTRAVENOUS

## 2019-08-19 MED ORDER — LIDOCAINE-EPINEPHRINE 1 %-1:100000 IJ SOLN
INTRAMUSCULAR | Status: DC | PRN
  Administered 2019-08-19: 1 mL

## 2019-08-19 MED ORDER — HYDROMORPHONE HCL 1 MG/ML IJ SOLN
0.2500 mg | INTRAMUSCULAR | Status: DC | PRN
  Administered 2019-08-19: 0.25 mg via INTRAVENOUS

## 2019-08-19 MED ORDER — PROMETHAZINE HCL 25 MG/ML IJ SOLN: 6.2500 mg | INTRAMUSCULAR | Status: DC | PRN

## 2019-08-19 MED ORDER — LACTATED RINGERS IV SOLN: INTRAVENOUS | Status: DC

## 2019-08-19 MED ORDER — ONDANSETRON HCL 4 MG/2ML IJ SOLN
INTRAMUSCULAR | Status: AC
  Filled 2019-08-19: qty 2

## 2019-08-19 MED ORDER — FENTANYL CITRATE (PF) 100 MCG/2ML IJ SOLN
INTRAMUSCULAR | Status: DC | PRN
  Administered 2019-08-19: 50 ug via INTRAVENOUS

## 2019-08-19 MED ORDER — LIDOCAINE 2% (20 MG/ML) 5 ML SYRINGE
INTRAMUSCULAR | Status: AC
  Filled 2019-08-19: qty 5

## 2019-08-19 MED ORDER — MIDAZOLAM HCL 2 MG/2ML IJ SOLN
INTRAMUSCULAR | Status: AC
  Filled 2019-08-19: qty 2

## 2019-08-19 MED ORDER — PROPOFOL 500 MG/50ML IV EMUL
INTRAVENOUS | Status: DC | PRN
Start: 2019-08-19 — End: 2019-08-19
  Administered 2019-08-19: 100 ug/kg/min via INTRAVENOUS

## 2019-08-19 MED ORDER — LIDOCAINE HCL (CARDIAC) PF 100 MG/5ML IV SOSY
PREFILLED_SYRINGE | INTRAVENOUS | Status: DC | PRN
  Administered 2019-08-19: 50 mg via INTRAVENOUS

## 2019-08-19 MED ORDER — MIDAZOLAM HCL 2 MG/2ML IJ SOLN
INTRAMUSCULAR | Status: DC | PRN
  Administered 2019-08-19: 2 mg via INTRAVENOUS

## 2019-08-19 MED ORDER — OXYCODONE HCL 5 MG/5ML PO SOLN: 5.0000 mg | Freq: Once | ORAL | Status: DC | PRN

## 2019-08-19 MED ORDER — PROPOFOL 10 MG/ML IV BOLUS
INTRAVENOUS | Status: DC | PRN
  Administered 2019-08-19: 20 mg via INTRAVENOUS
  Administered 2019-08-19: 40 mg via INTRAVENOUS

## 2019-08-19 SURGICAL SUPPLY — 32 items
BLADE SURG 15 STRL LF DISP TIS (BLADE) ×1 IMPLANT
BLADE SURG 15 STRL SS (BLADE) ×2
CANISTER SUCT 1200ML W/VALVE (MISCELLANEOUS) ×3 IMPLANT
COVER MAYO STAND STRL (DRAPES) ×3 IMPLANT
COVER WAND RF STERILE (DRAPES) IMPLANT
DECANTER SPIKE VIAL GLASS SM (MISCELLANEOUS) IMPLANT
ELECT REM PT RETURN 9FT ADLT (ELECTROSURGICAL) ×3
ELECTRODE REM PT RTRN 9FT ADLT (ELECTROSURGICAL) ×1 IMPLANT
GAUZE 4X4 16PLY RFD (DISPOSABLE) IMPLANT
GAUZE SPONGE 4X4 12PLY STRL LF (GAUZE/BANDAGES/DRESSINGS) IMPLANT
GLOVE BIO SURGEON STRL SZ7.5 (GLOVE) ×3 IMPLANT
GLOVE BIOGEL PI IND STRL 6.5 (GLOVE) ×1 IMPLANT
GLOVE BIOGEL PI INDICATOR 6.5 (GLOVE) ×2
GLOVE ECLIPSE 6.5 STRL STRAW (GLOVE) ×3 IMPLANT
GOWN STRL REUS W/ TWL LRG LVL3 (GOWN DISPOSABLE) ×2 IMPLANT
GOWN STRL REUS W/TWL LRG LVL3 (GOWN DISPOSABLE) ×4
MARKER SKIN DUAL TIP RULER LAB (MISCELLANEOUS) IMPLANT
NEEDLE PRECISIONGLIDE 27X1.5 (NEEDLE) ×3 IMPLANT
NS IRRIG 1000ML POUR BTL (IV SOLUTION) ×3 IMPLANT
PENCIL SMOKE EVACUATOR (MISCELLANEOUS) ×3 IMPLANT
SCISSORS WIRE ANG 4 3/4 DISP (INSTRUMENTS) IMPLANT
SET BASIN DAY SURGERY F.S. (CUSTOM PROCEDURE TRAY) ×3 IMPLANT
SHEET MEDIUM DRAPE 40X70 STRL (DRAPES) ×3 IMPLANT
SUT CHROMIC 3 0 PS 2 (SUTURE) IMPLANT
SUT CHROMIC 4 0 PS 2 18 (SUTURE) IMPLANT
SUT CHROMIC 4 0 RB 1X27 (SUTURE) IMPLANT
SYR CONTROL 10ML LL (SYRINGE) ×3 IMPLANT
TOWEL GREEN STERILE FF (TOWEL DISPOSABLE) ×3 IMPLANT
TRAY DSU PREP LF (CUSTOM PROCEDURE TRAY) IMPLANT
TUBE CONNECTING 20'X1/4 (TUBING) ×1
TUBE CONNECTING 20X1/4 (TUBING) ×2 IMPLANT
YANKAUER SUCT BULB TIP NO VENT (SUCTIONS) IMPLANT
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
## 2019-08-19 NOTE — Discharge Instructions (Addendum)
The patient may resume all his previous activities.  He may advance his diet as tolerated.  He may follow-up in my office as needed.   Call your surgeon if you experience:   1.  Fever over 101.0. 2.  Inability to urinate. 3.  Nausea and/or vomiting. 4.  Extreme swelling or bruising at the surgical site. 5.  Continued bleeding from the incision. 6.  Increased pain, redness or drainage from the incision. 7.  Problems related to your pain medication. 8.  Any problems and/or concerns    Post Anesthesia Home Care Instructions  Activity: Get plenty of rest for the remainder of the day. A responsible individual must stay with you for 24 hours following the procedure.  For the next 24 hours, DO NOT: -Drive a car -Advertising copywriter -Drink alcoholic beverages -Take any medication unless instructed by your physician -Make any legal decisions or sign important papers.  Meals: Start with liquid foods such as gelatin or soup. Progress to regular foods as tolerated. Avoid greasy, spicy, heavy foods. If nausea and/or vomiting occur, drink only clear liquids until the nausea and/or vomiting subsides. Call your physician if vomiting continues.  Special Instructions/Symptoms: Your throat may feel dry or sore from the anesthesia or the breathing tube placed in your throat during surgery. If this causes discomfort, gargle with warm salt water. The discomfort should disappear within 24 hours.  If you had a scopolamine patch placed behind your ear for the management of post- operative nausea and/or vomiting:  1. The medication in the patch is effective for 72 hours, after which it should be removed.  Wrap patch in a tissue and discard in the trash. Wash hands thoroughly with soap and water. 2. You may remove the patch earlier than 72 hours if you experience unpleasant side effects which may include dry mouth, dizziness or visual disturbances. 3. Avoid touching the patch. Wash your hands with soap and  water after contact with the patch.

## 2019-08-19 NOTE — Transfer of Care (Signed)
Immediate Anesthesia Transfer of Care Note  Patient: Justin Chen  Procedure(s) Performed: MANDIBULAR HARDWARE REMOVAL (Bilateral Mouth)  Patient Location: PACU  Anesthesia Type:MAC  Level of Consciousness: awake, alert , oriented, drowsy and patient cooperative  Airway & Oxygen Therapy: Patient Spontanous Breathing and Patient connected to nasal cannula oxygen  Post-op Assessment: Report given to RN and Post -op Vital signs reviewed and stable  Post vital signs: Reviewed and stable  Last Vitals:  Vitals Value Taken Time  BP 137/89 08/19/19 1134  Temp    Pulse 66 08/19/19 1135  Resp 14 08/19/19 1135  SpO2 100 % 08/19/19 1135  Vitals shown include unvalidated device data.  Last Pain:  Vitals:   08/19/19 1025  TempSrc: Oral  PainSc: 0-No pain         Complications: No complications documented.

## 2019-08-19 NOTE — Anesthesia Preprocedure Evaluation (Deleted)
Anesthesia Evaluation  Patient identified by MRN, date of birth, ID band Patient awake    Reviewed: Allergy & Precautions, NPO status , Patient's Chart, lab work & pertinent test results  History of Anesthesia Complications Negative for: history of anesthetic complications  Airway Mallampati: IV  TM Distance: >3 FB Neck ROM: Full  Mouth opening: Limited Mouth Opening  Dental no notable dental hx. (+) Teeth Intact, Dental Advisory Given   Pulmonary  Bilateral occult pneumothoraces, no respiratory distress, on room air   Pulmonary exam normal breath sounds clear to auscultation       Cardiovascular negative cardio ROS Normal cardiovascular exam Rhythm:Regular Rate:Normal     Neuro/Psych negative neurological ROS  negative psych ROS   GI/Hepatic negative GI ROS, Neg liver ROS,   Endo/Other  negative endocrine ROS  Renal/GU negative Renal ROS  negative genitourinary   Musculoskeletal negative musculoskeletal ROS (+)   Abdominal   Peds  Hematology negative hematology ROS (+)   Anesthesia Other Findings   Reproductive/Obstetrics                             Anesthesia Physical  Anesthesia Plan  ASA: II  Anesthesia Plan: MAC   Post-op Pain Management:    Induction: Intravenous  PONV Risk Score and Plan: 1 and Ondansetron and Treatment may vary due to age or medical condition  Airway Management Planned:   Additional Equipment: None  Intra-op Plan:   Post-operative Plan:   Informed Consent: I have reviewed the patients History and Physical, chart, labs and discussed the procedure including the risks, benefits and alternatives for the proposed anesthesia with the patient or authorized representative who has indicated his/her understanding and acceptance.     Dental advisory given  Plan Discussed with:   Anesthesia Plan Comments:         Anesthesia Quick Evaluation                                   Anesthesia Evaluation  Patient identified by MRN, date of birth, ID band Patient awake    Reviewed: Allergy & Precautions, NPO status , Patient's Chart, lab work & pertinent test results  History of Anesthesia Complications Negative for: history of anesthetic complications  Airway Mallampati: IV  TM Distance: >3 FB Neck ROM: Full  Mouth opening: Limited Mouth Opening  Dental  (+) Teeth Intact, Dental Advisory Given   Pulmonary  Bilateral occult pneumothoraces, no respiratory distress, on room air   Pulmonary exam normal        Cardiovascular negative cardio ROS Normal cardiovascular exam     Neuro/Psych negative neurological ROS  negative psych ROS   GI/Hepatic negative GI ROS, Neg liver ROS,   Endo/Other  negative endocrine ROS  Renal/GU negative Renal ROS  negative genitourinary   Musculoskeletal negative musculoskeletal ROS (+)   Abdominal   Peds  Hematology negative hematology ROS (+)   Anesthesia Other Findings   Reproductive/Obstetrics                          Anesthesia Physical Anesthesia Plan  ASA: II  Anesthesia Plan: General   Post-op Pain Management:    Induction: Intravenous  PONV Risk Score and Plan: 2 and Ondansetron, Dexamethasone, Treatment may vary due to age or medical condition and Midazolam  Airway Management Planned: Nasal ETT  Additional Equipment: None  Intra-op Plan:   Post-operative Plan: Extubation in OR  Informed Consent: I have reviewed the patients History and Physical, chart, labs and discussed the procedure including the risks, benefits and alternatives for the proposed anesthesia with the patient or authorized representative who has indicated his/her understanding and acceptance.     Dental advisory given  Plan Discussed with:   Anesthesia Plan Comments:         Anesthesia Quick Evaluation

## 2019-08-19 NOTE — Anesthesia Postprocedure Evaluation (Signed)
Anesthesia Post Note  Patient: Justin Chen  Procedure(s) Performed: MANDIBULAR HARDWARE REMOVAL (Bilateral Mouth)     Patient location during evaluation: PACU Anesthesia Type: MAC Level of consciousness: awake and alert Pain management: pain level controlled Vital Signs Assessment: post-procedure vital signs reviewed and stable Respiratory status: spontaneous breathing, nonlabored ventilation and respiratory function stable Cardiovascular status: blood pressure returned to baseline and stable Postop Assessment: no apparent nausea or vomiting Anesthetic complications: no   No complications documented.  Last Vitals:  Vitals:   08/19/19 1230 08/19/19 1251  BP: (!) 143/110 (!) 158/107  Pulse: (!) 50 (!) 57  Resp: 13 16  Temp:  36.6 C  SpO2: 100% 100%    Last Pain:  Vitals:   08/19/19 1251  TempSrc:   PainSc: 3                  Lynda Rainwater

## 2019-08-19 NOTE — Anesthesia Preprocedure Evaluation (Signed)
Anesthesia Evaluation  Patient identified by MRN, date of birth, ID band Patient awake    Reviewed: Allergy & Precautions, NPO status , Patient's Chart, lab work & pertinent test results  History of Anesthesia Complications Negative for: history of anesthetic complications  Airway Mallampati: IV  TM Distance: >3 FB Neck ROM: Full  Mouth opening: Limited Mouth Opening  Dental  (+) Teeth Intact, Dental Advisory Given   Pulmonary  Bilateral occult pneumothoraces, no respiratory distress, on room air   Pulmonary exam normal        Cardiovascular negative cardio ROS Normal cardiovascular exam     Neuro/Psych negative neurological ROS  negative psych ROS   GI/Hepatic negative GI ROS, Neg liver ROS,   Endo/Other  negative endocrine ROS  Renal/GU negative Renal ROS  negative genitourinary   Musculoskeletal negative musculoskeletal ROS (+)   Abdominal   Peds  Hematology negative hematology ROS (+)   Anesthesia Other Findings   Reproductive/Obstetrics                             Anesthesia Physical  Anesthesia Plan  ASA: II  Anesthesia Plan: MAC   Post-op Pain Management:    Induction: Intravenous  PONV Risk Score and Plan: 1 and Ondansetron and Treatment may vary due to age or medical condition  Airway Management Planned:   Additional Equipment: None  Intra-op Plan:   Post-operative Plan:   Informed Consent: I have reviewed the patients History and Physical, chart, labs and discussed the procedure including the risks, benefits and alternatives for the proposed anesthesia with the patient or authorized representative who has indicated his/her understanding and acceptance.     Dental advisory given  Plan Discussed with:   Anesthesia Plan Comments:         Anesthesia Quick Evaluation

## 2019-08-20 ENCOUNTER — Encounter (HOSPITAL_BASED_OUTPATIENT_CLINIC_OR_DEPARTMENT_OTHER): Payer: Self-pay | Admitting: Otolaryngology

## 2021-06-19 ENCOUNTER — Ambulatory Visit
Admission: RE | Admit: 2021-06-19 | Discharge: 2021-06-19 | Disposition: A | Discharge status: Home / Self Care | Payer: Medicaid Other | Source: Ambulatory Visit | Attending: Physician Assistant | Admitting: Physician Assistant

## 2021-06-19 ENCOUNTER — Other Ambulatory Visit: Payer: Self-pay

## 2021-06-19 VITALS — BP 157/85 | HR 76 | Temp 98.0°F | Resp 15 | Ht 69.0 in | Wt 150.0 lb

## 2021-06-19 DIAGNOSIS — Z202 Contact with and (suspected) exposure to infections with a predominantly sexual mode of transmission: Secondary | ICD-10-CM | POA: Insufficient documentation

## 2021-06-19 DIAGNOSIS — R369 Urethral discharge, unspecified: Secondary | ICD-10-CM

## 2021-06-19 DIAGNOSIS — N342 Other urethritis: Secondary | ICD-10-CM | POA: Diagnosis not present

## 2021-06-19 LAB — URINALYSIS, MICROSCOPIC (REFLEX): WBC, UA: >50 WBC/hpf (ref 0–5)

## 2021-06-19 LAB — URINALYSIS, ROUTINE W REFLEX MICROSCOPIC
Bilirubin Urine: NEGATIVE
Glucose, UA: NEGATIVE mg/dL
Hgb urine dipstick: NEGATIVE
Ketones, ur: NEGATIVE mg/dL
Nitrite: NEGATIVE
Protein, ur: NEGATIVE mg/dL
Specific Gravity, Urine: 1.02 (ref 1.005–1.030)
pH: 6.5 (ref 5.0–8.0)

## 2021-06-19 MED ORDER — CEFTRIAXONE SODIUM 500 MG IJ SOLR
500.0000 mg | Freq: Once | INTRAMUSCULAR | Status: AC
  Administered 2021-06-19: 500 mg via INTRAMUSCULAR

## 2021-06-19 MED ORDER — DOXYCYCLINE HYCLATE 100 MG PO CAPS: 100.0000 mg | ORAL_CAPSULE | Freq: Two times a day (BID) | ORAL | 0 refills | Status: AC
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
## 2021-06-19 NOTE — ED Triage Notes (Signed)
Patient c/o burning when urinating and penile discharge that started 2 days ago.   ?

## 2021-06-19 NOTE — Discharge Instructions (Signed)
-  You will likely have chlamydia or gonorrhea.  We have treated you for both conditions.  The doxycycline at the pharmacy will treat you for chlamydia.  That seems most likely.  You need to use protection in the future with anyone.  You need to not have any sexual intercourse until 1 week after you have completed treatment.  Let the partner know that you tested positive for something if you do.  They need to be treated as well. ?-Follow-up with [place] as needed. ?

## 2021-06-20 LAB — URINE CULTURE: Culture: NO GROWTH

## 2021-06-21 LAB — CYTOLOGY, (ORAL, ANAL, URETHRAL) ANCILLARY ONLY
Chlamydia: NEGATIVE
Comment: NEGATIVE
Comment: NEGATIVE
Comment: NORMAL
Neisseria Gonorrhea: POSITIVE — AB
Trichomonas: NEGATIVE

## 2022-03-04 ENCOUNTER — Ambulatory Visit
Admission: EM | Admit: 2022-03-04 | Discharge: 2022-03-04 | Disposition: A | Discharge status: Home / Self Care | Payer: Medicaid Other | Attending: Emergency Medicine | Admitting: Emergency Medicine

## 2022-03-04 ENCOUNTER — Encounter: Payer: Self-pay | Admitting: Emergency Medicine

## 2022-03-04 DIAGNOSIS — R369 Urethral discharge, unspecified: Secondary | ICD-10-CM | POA: Insufficient documentation

## 2022-03-04 LAB — URINALYSIS, ROUTINE W REFLEX MICROSCOPIC
Bilirubin Urine: NEGATIVE
Glucose, UA: NEGATIVE mg/dL
Hgb urine dipstick: NEGATIVE
Ketones, ur: NEGATIVE mg/dL
Leukocytes,Ua: NEGATIVE
Nitrite: NEGATIVE
Protein, ur: NEGATIVE mg/dL
Specific Gravity, Urine: 1.02 (ref 1.005–1.030)
pH: 7 (ref 5.0–8.0)

## 2022-03-04 MED ORDER — CEFTRIAXONE SODIUM 500 MG IJ SOLR
500.0000 mg | Freq: Once | INTRAMUSCULAR | Status: AC
  Administered 2022-03-04: 500 mg via INTRAMUSCULAR

## 2022-03-04 NOTE — ED Triage Notes (Signed)
Patient states that he had some penile discharge yesterday.  Patient denies any pain. Or urinary symptoms.
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
## 2022-03-04 NOTE — Discharge Instructions (Signed)
Your urinalysis today did not show the presence of infection but you did have yellow puslike drainage from your penis.  For that reason, we are going to treat you empirically for gonorrhea with an injection of ceftriaxone here in clinic.  We did collect a sample and will send it for analysis.  If you test positive for gonorrhea, chlamydia, or trichomoniasis you will be contacted by phone.  Either abstain from intercourse or wear a condom until after your test results have returned.

## 2022-03-07 LAB — CYTOLOGY, (ORAL, ANAL, URETHRAL) ANCILLARY ONLY
Chlamydia: NEGATIVE
Comment: NEGATIVE
Comment: NEGATIVE
Comment: NORMAL
Neisseria Gonorrhea: NEGATIVE
Trichomonas: NEGATIVE

## 2022-03-10 ENCOUNTER — Ambulatory Visit
Admission: RE | Admit: 2022-03-10 | Discharge: 2022-03-10 | Disposition: A | Discharge status: Home / Self Care | Payer: BLUE CROSS/BLUE SHIELD | Source: Ambulatory Visit

## 2022-03-10 VITALS — BP 167/112 | HR 60 | Temp 98.2°F | Resp 15 | Ht 69.0 in | Wt 140.0 lb

## 2022-03-10 DIAGNOSIS — R369 Urethral discharge, unspecified: Secondary | ICD-10-CM | POA: Diagnosis not present
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
## 2022-03-10 NOTE — Discharge Instructions (Signed)
At this time there is no further workup recommended completed here in urgent care for your symptoms as you tested negative for STDs and urine was also negative at your last visit on 12/30  On your visit on 12/30 you were treated prophylactically for gonorrhea with Rocephin, at this time is hard to determine if symptoms resolved on their own or due to medication as all testing for STDs were negative  You will need further evaluation and workup completed by urology which is the bladder and penile specialist, their information is listed on your front page,  please reach out and schedule an appointment

## 2022-03-10 NOTE — ED Triage Notes (Signed)
Patient was tested 03/04/22 STD and UTI all was negative.  Patient states that he was treated for Gonorrhea at his last visit.  Patient reports ongoing penile discharge.
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
## 2022-03-16 ENCOUNTER — Ambulatory Visit (INDEPENDENT_AMBULATORY_CARE_PROVIDER_SITE_OTHER): Payer: BLUE CROSS/BLUE SHIELD | Admitting: Urology

## 2022-03-16 ENCOUNTER — Encounter: Payer: Self-pay | Admitting: Urology

## 2022-03-16 VITALS — BP 156/100 | HR 82 | Ht 69.0 in | Wt 146.0 lb

## 2022-03-16 DIAGNOSIS — R369 Urethral discharge, unspecified: Secondary | ICD-10-CM

## 2022-03-16 LAB — URINALYSIS, COMPLETE
Bilirubin, UA: NEGATIVE
Glucose, UA: NEGATIVE
Ketones, UA: NEGATIVE
Leukocytes,UA: NEGATIVE
Nitrite, UA: NEGATIVE
RBC, UA: NEGATIVE
Specific Gravity, UA: 1.025 (ref 1.005–1.030)
Urobilinogen, Ur: 0.2 mg/dL (ref 0.2–1.0)
pH, UA: 5.5 (ref 5.0–7.5)

## 2022-03-16 LAB — MICROSCOPIC EXAMINATION
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
## 2022-03-16 MED ORDER — GENTAMICIN SULFATE 40 MG/ML IJ SOLN
80.0000 mg | Freq: Once | INTRAMUSCULAR | Status: AC
  Administered 2022-03-16: 80 mg via INTRAMUSCULAR

## 2022-03-16 MED ORDER — GENTAMICIN SULFATE 40 MG/ML IJ SOLN: 240.0000 mg | Freq: Once | INTRAMUSCULAR | Status: DC

## 2022-03-16 MED ORDER — DOXYCYCLINE HYCLATE 100 MG PO CAPS: 100.0000 mg | ORAL_CAPSULE | Freq: Two times a day (BID) | ORAL | 0 refills | Status: DC

## 2022-03-16 NOTE — Progress Notes (Signed)
   03/16/22 1:17 PM   Justin Chen 2000-04-06 [id number]  CC: Penile discharge  HPI: 22 year old male with painless penile discharge that reportedly started on 03/03/2022.  He was seen in the ER on 03/04/2022 and urinalysis and STD testing were negative.  He was noted to have yellow discharge from the meatus on exam, and he was treated with IM ceftriaxone for possible gonorrhea.  He denies any improvement after that medication.  He also was treated for proven gonorrhea in April 2023 with doxycycline and ceftriaxone with resolution of his symptoms at that time.  He denies any pain with urination or other urinary problems.  He continues to have some intermittent discharge.  Urinalysis today is benign.  Surgical History: Past Surgical History:  Procedure Laterality Date   CHEST TUBE INSERTION Right 07/14/2019   Procedure: Chest Tube Insertion;  Surgeon: Leta Baptist, MD;  Location: [place];  Service: ENT;  Laterality: Right;   CLOSED REDUCTION MANDIBLE WITH MANDIBULOMA N/A 07/14/2019   Procedure: CLOSED REDUCTION MANDIBLE WITH MANDIBULOMAXILLARY FUSION;  Surgeon: Leta Baptist, MD;  Location: [place];  Service: ENT;  Laterality: N/A;   MANDIBULAR HARDWARE REMOVAL Bilateral 08/19/2019   Procedure: MANDIBULAR HARDWARE REMOVAL;  Surgeon: Leta Baptist, MD;  Location: [place];  Service: ENT;  Laterality: Bilateral;    Social History:  reports that he has never smoked. He has never used smokeless tobacco. He reports that he does not drink alcohol and does not use drugs.  Physical Exam: BP (!) 156/100 (BP Location: Left Arm, Patient Position: Sitting, Cuff Size: Normal)   Pulse 82   Ht 5\' 9"  (1.753 m)   Wt 146 lb (66.2 kg)   BMI 21.56 kg/m    Constitutional:  Alert and oriented, No acute distress. Cardiovascular: No clubbing, cyanosis, or edema. Respiratory: Normal respiratory effort, no increased work of breathing. GI: Abdomen is soft, nontender, nondistended, no abdominal masses GU:  Circumcised phallus with patent meatus, no lesions, no discharge, testicles 20 cc of send bilaterally, nontender   Assessment & Plan:   22 year old male with reported painless yellow penile discharge, no other symptoms.  STD testing was negative on 03/04/2022, and he was treated with IM ceftriaxone with no improvement in symptoms.  Notably, he was diagnosed with gonorrhea in April 2023, and symptoms resolved at that time with ceftriaxone and doxycycline.  Exam today is benign.  Urinalysis sent for STD testing, culture, and atypicals.  I recommended empiric treatment for gonorrhea/chlamydia/urethritis with IM gentamicin and oral doxycycline 100 mg twice daily x 7 days.  Return precautions were discussed   Nickolas Madrid, MD 03/16/2022  [facility] [address] [place], [place] [address] [phone]

## 2022-03-16 NOTE — Addendum Note (Signed)
Addended by: Donalee Citrin on: 03/16/2022 01:28 PM   Modules accepted: Orders

## 2022-03-16 NOTE — Addendum Note (Signed)
Addended by: Donalee Citrin on: 03/16/2022 01:36 PM   Modules accepted: Orders

## 2022-03-19 LAB — CULTURE, URINE COMPREHENSIVE

## 2022-03-20 LAB — CHLAMYDIA/GONOCOCCUS/TRICHOMONAS, NAA
Chlamydia by NAA: NEGATIVE
Gonococcus by NAA: NEGATIVE
Trich vag by NAA: NEGATIVE

## 2022-03-22 ENCOUNTER — Other Ambulatory Visit: Payer: Self-pay | Admitting: *Deleted

## 2022-03-22 MED ORDER — DOXYCYCLINE HYCLATE 100 MG PO CAPS: 100.0000 mg | ORAL_CAPSULE | Freq: Two times a day (BID) | ORAL | 0 refills | Status: DC

## 2022-03-22 NOTE — Telephone Encounter (Signed)
From: Prince Solian To: Office of Billey Co, MD Sent: 03/22/2022 12:47 AM EST Subject: Medication Renewal Request  Refills have been requested for the following medications:   doxycycline (VIBRAMYCIN) 100 MG capsule [Brian C Sninsky]  Preferred pharmacy: [facility] [place], [place] Delivery method: [employer]

## 2022-03-24 LAB — MYCOPLASMA / UREAPLASMA CULTURE
Mycoplasma hominis Culture: NEGATIVE
Ureaplasma urealyticum: POSITIVE — AB

## 2022-04-24 ENCOUNTER — Other Ambulatory Visit: Payer: Self-pay | Admitting: *Deleted

## 2022-04-24 NOTE — Telephone Encounter (Signed)
From: Prince Solian To: Office of Billey Co, MD Sent: 04/24/2022 12:15 PM EST Subject: Medication Renewal Request  Refills have been requested for the following medications:   doxycycline (VIBRAMYCIN) 100 MG capsule [Brian C Sninsky]  Patient Comment: My partner was recently prescribed antibiotics after an appointment  Preferred pharmacy: [facility] [place], [place] Delivery method: [employer]

## 2022-04-24 NOTE — Telephone Encounter (Signed)
Spoke with patient and his partner she tested positive for mycoplasma/ureaplasma. Pt is asking for new abx, please advise

## 2022-04-25 ENCOUNTER — Ambulatory Visit
Admission: RE | Admit: 2022-04-25 | Discharge: 2022-04-25 | Disposition: A | Discharge status: Home / Self Care | Payer: BLUE CROSS/BLUE SHIELD | Source: Ambulatory Visit | Attending: Family Medicine | Admitting: Family Medicine

## 2022-04-25 ENCOUNTER — Other Ambulatory Visit: Payer: Self-pay

## 2022-04-25 VITALS — BP 148/99 | HR 70 | Temp 98.4°F | Resp 16 | Ht 69.0 in | Wt 140.0 lb

## 2022-04-25 DIAGNOSIS — N341 Nonspecific urethritis: Secondary | ICD-10-CM

## 2022-04-25 DIAGNOSIS — A493 Mycoplasma infection, unspecified site: Secondary | ICD-10-CM

## 2022-04-25 LAB — URINALYSIS, W/ REFLEX TO CULTURE (INFECTION SUSPECTED)
Bilirubin Urine: NEGATIVE
Glucose, UA: NEGATIVE mg/dL
Hgb urine dipstick: NEGATIVE
Ketones, ur: NEGATIVE mg/dL
Leukocytes,Ua: NEGATIVE
Nitrite: NEGATIVE
Protein, ur: NEGATIVE mg/dL
RBC / HPF: NONE SEEN RBC/hpf (ref 0–5)
Specific Gravity, Urine: 1.02 (ref 1.005–1.030)
pH: 6.5 (ref 5.0–8.0)

## 2022-04-25 MED ORDER — MOXIFLOXACIN HCL 400 MG PO TABS: 400.0000 mg | ORAL_TABLET | Freq: Every day | ORAL | 0 refills | Status: AC

## 2022-04-25 MED ORDER — DOXYCYCLINE HYCLATE 100 MG PO CAPS: 100.0000 mg | ORAL_CAPSULE | Freq: Two times a day (BID) | ORAL | 0 refills | Status: DC
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
## 2022-04-25 NOTE — ED Triage Notes (Signed)
Pt states he wants to get more Doxycycline. He was treated by urology on 03/16/22 for ureaplasma urealyticum with Doxycycline and Gent. His GF was told by her provider that he should have had another round of Doxy. Pt denies sx currently.

## 2022-04-25 NOTE — Discharge Instructions (Addendum)
Take doxycycline twice a day for 7 days. After you complete this medication, start your moxifloxacin once a day for 10 days.  Do not have sex for the next 2 weeks.  Follow up with your urologist to ensure resolution of your infection.

## 2022-05-02 LAB — MISC LABCORP TEST (SEND OUT): Labcorp test code: 86884

## 2022-05-31 ENCOUNTER — Ambulatory Visit: Payer: BLUE CROSS/BLUE SHIELD | Admitting: Urology

## 2022-05-31 ENCOUNTER — Encounter: Payer: Self-pay | Admitting: Urology

## 2022-12-18 ENCOUNTER — Ambulatory Visit
Admission: EM | Admit: 2022-12-18 | Discharge: 2022-12-18 | Disposition: A | Discharge status: Home / Self Care | Payer: BLUE CROSS/BLUE SHIELD | Attending: Internal Medicine | Admitting: Internal Medicine

## 2022-12-18 DIAGNOSIS — B359 Dermatophytosis, unspecified: Secondary | ICD-10-CM | POA: Diagnosis not present

## 2022-12-18 MED ORDER — CLOTRIMAZOLE 1 % EX CREA: TOPICAL_CREAM | CUTANEOUS | 0 refills | Status: DC

## 2022-12-18 NOTE — Discharge Instructions (Signed)
Start clotrimazole topically twice daily for the next 7 to 10 days.  Keep area clean and dry.  Follow-up with your PCP if your symptoms do not improve.  Please go to the ER for any worsening symptoms.  Hope you feel better soon!

## 2022-12-18 NOTE — ED Triage Notes (Signed)
Patient presents to UC for redness, penile irritation x 7 days. No OTC meds for symptom relief.   Denies discharge, concern for STD.
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
## 2023-01-30 ENCOUNTER — Encounter: Payer: Self-pay | Admitting: Physician Assistant

## 2023-01-30 ENCOUNTER — Ambulatory Visit (INDEPENDENT_AMBULATORY_CARE_PROVIDER_SITE_OTHER): Payer: BLUE CROSS/BLUE SHIELD | Admitting: Physician Assistant

## 2023-01-30 VITALS — BP 148/88 | HR 66 | Temp 98.2°F | Ht 69.0 in | Wt 158.0 lb

## 2023-01-30 DIAGNOSIS — I10 Essential (primary) hypertension: Secondary | ICD-10-CM

## 2023-01-30 MED ORDER — HYDROCHLOROTHIAZIDE 25 MG PO TABS: ORAL_TABLET | ORAL | 1 refills | Status: DC

## 2023-01-30 NOTE — Progress Notes (Signed)
Date:  01/30/2023   Name:  Justin Chen   DOB:  March 04, 2001   MRN:  [id number]   Chief Complaint: Establish Care  HPI Justin Chen is a very pleasant 22 y.o. male with a history of untreated HTN (by chart review) but no other medical conditions who presents new to the clinic today to establish care.   He is somewhat concerned about his blood pressure, has never taken medication for this problem.  Does not currently monitor blood pressure.  He has never had a PCP aside from pediatrics.  Grandfather with HTN.  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  No dedicated cardio.  Does not consume much caffeine, and has not had any today.   Medication list has been reviewed and updated.  Current Meds  Medication Sig   hydrochlorothiazide (HYDRODIURIL) 25 MG tablet Take 0.5 tablets (12.5 mg total) by mouth daily for 7 days, THEN 1 tablet (25 mg total) daily.     Review of Systems  There are no problems to display for this patient.   No Known Allergies  Immunization History  Administered Date(s) Administered   Tdap 07/13/2019    Past Surgical History:  Procedure Laterality Date   CHEST TUBE INSERTION Right 07/14/2019   Procedure: Chest Tube Insertion;  Surgeon: Newman Pies, MD;  Location: [facility] OR;  Service: ENT;  Laterality: Right;   CLOSED REDUCTION MANDIBLE WITH MANDIBULOMA N/A 07/14/2019   Procedure: CLOSED REDUCTION MANDIBLE WITH MANDIBULOMAXILLARY FUSION;  Surgeon: Newman Pies, MD;  Location: [facility] OR;  Service: ENT;  Laterality: N/A;   MANDIBULAR HARDWARE REMOVAL Bilateral 08/19/2019   Procedure: MANDIBULAR HARDWARE REMOVAL;  Surgeon: Newman Pies, MD;  Location: [facility];  Service: ENT;  Laterality: Bilateral;    Social History   Tobacco Use   Smoking status: Never   Smokeless tobacco: Never  Vaping Use   Vaping status: Never Used  Substance Use Topics   Alcohol use: Yes    Comment: ocassionally   Drug use: Never     Family History  Problem Relation Age of Onset   Diabetes Father    Obesity Father    Lung cancer Maternal Grandfather    Hypertension Paternal Grandfather         01/30/2023   10:52 AM  GAD 7 : Generalized Anxiety Score  Nervous, Anxious, on Edge 2  Control/stop worrying 0  Worry too much - different things 3  Trouble relaxing 0  Restless 2  Easily annoyed or irritable 0  Afraid - awful might happen 1  Total GAD 7 Score 8  Anxiety Difficulty Not difficult at all       01/30/2023   10:52 AM  Depression screen PHQ 2/9  Decreased Interest 0  Down, Depressed, Hopeless 1  PHQ - 2 Score 1  Altered sleeping 0  Tired, decreased energy 0  Change in appetite 0  Feeling bad or failure about yourself  0  Trouble concentrating 0  Moving slowly or fidgety/restless 1  Suicidal thoughts 0  PHQ-9 Score 2  Difficult doing work/chores Not difficult at all    BP Readings from Last 3 Encounters:  01/30/23 (!) 148/88  12/18/22 (!) 157/116  04/25/22 (!) 148/99    Wt Readings from Last 3 Encounters:  01/30/23 158 lb (71.7 kg)  04/25/22 139 lb 15.9 oz (63.5 kg)  03/16/22 146 lb (66.2 kg)    BP (!) 148/88 (BP Location: Left  Arm, Patient Position: Sitting, Cuff Size: Normal)   Pulse 66   Temp 98.2 F (36.8 C) (Oral)   Ht 5\' 9"  (1.753 m)   Wt 158 lb (71.7 kg)   SpO2 99%   BMI 23.33 kg/m   Physical Exam Vitals and nursing note reviewed.  Constitutional:      Appearance: Normal appearance.  Cardiovascular:     Rate and Rhythm: Normal rate and regular rhythm.     Heart sounds: No murmur heard.    No friction rub. No gallop.  Pulmonary:     Effort: Pulmonary effort is normal.     Breath sounds: Normal breath sounds.  Abdominal:     General: There is no distension.  Musculoskeletal:        General: Normal range of motion.  Skin:    General: Skin is warm and dry.  Neurological:     Mental Status: He is alert and oriented to person, place, and time.     Gait: Gait  is intact.  Psychiatric:        Mood and Affect: Mood and affect normal.     Recent Labs     Component Value Date/Time   NA 138 07/15/2019 0612   K 3.7 07/15/2019 0612   CL 100 07/15/2019 0612   CO2 27 07/15/2019 0612   GLUCOSE 89 07/15/2019 0612   BUN 10 07/15/2019 0612   CREATININE 1.09 07/15/2019 0612   CALCIUM 9.4 07/15/2019 0612   PROT 7.1 07/13/2019 1715   ALBUMIN 4.3 07/13/2019 1715   AST 96 (H) 07/13/2019 1715   ALT 117 (H) 07/13/2019 1715   ALKPHOS 36 (L) 07/13/2019 1715   BILITOT 1.9 (H) 07/13/2019 1715   GFRNONAA >60 07/15/2019 0612   GFRAA >60 07/15/2019 0612    Lab Results  Component Value Date   WBC 9.1 07/15/2019   HGB 12.5 (L) 07/15/2019   HCT 37.9 (L) 07/15/2019   MCV 90.9 07/15/2019   PLT 155 07/15/2019   No results found for: "HGBA1C" No results found for: "CHOL", "HDL", "LDLCALC", "LDLDIRECT", "TRIG", "CHOLHDL" No results found for: "TSH"   Assessment and Plan:  1. Primary hypertension Patient has never had systolic <145 on chart review.  Encouraged home BP monitoring with BP log for my review next time.  Initiate HCTZ as below.  Plan for routine labs in 6 weeks with complete physical.  Reviewed the "Seven S's" of hypertension including salt, smoking, stimulants (e.g. caffeine), stress, sleep, snoring (OSA), sedentary lifestyle.  - hydrochlorothiazide (HYDRODIURIL) 25 MG tablet; Take 0.5 tablets (12.5 mg total) by mouth daily for 7 days, THEN 1 tablet (25 mg total) daily.  Dispense: 30 tablet; Refill: 1    Return in about 6 weeks (around 03/13/2023) for CPE.    Alvester Morin, PA-C, DMSc, Nutritionist [facility] Primary Care and Sports Medicine [facility] [phone]

## 2023-01-30 NOTE — Patient Instructions (Signed)
-  It was a pleasure to see you today! Please review your visit summary for helpful information -I would encourage you to follow your care via MyChart where you can access lab results, notes, messages, and more -If you feel that we did a nice job today, please complete your after-visit survey and leave [place] a Google review! Your CMA today was Mariann Barter and your provider was Alvester Morin, PA-C, DMSc -Please return for follow-up in about 6 weeks  Reviewed the "Seven S's" of hypertension including salt, smoking, stimulants (e.g. caffeine), stress, sleep, snoring (OSA), sedentary lifestyle.

## 2023-03-23 ENCOUNTER — Ambulatory Visit (INDEPENDENT_AMBULATORY_CARE_PROVIDER_SITE_OTHER): Payer: BLUE CROSS/BLUE SHIELD | Admitting: Physician Assistant

## 2023-03-23 ENCOUNTER — Encounter: Payer: Self-pay | Admitting: Physician Assistant

## 2023-03-23 VITALS — BP 142/94 | HR 93 | Temp 97.9°F | Ht 69.0 in | Wt 151.0 lb

## 2023-03-23 DIAGNOSIS — I10 Essential (primary) hypertension: Secondary | ICD-10-CM | POA: Insufficient documentation

## 2023-03-23 MED ORDER — HYDROCHLOROTHIAZIDE 25 MG PO TABS: 25.0000 mg | ORAL_TABLET | Freq: Every day | ORAL | 1 refills | Status: DC

## 2023-03-23 NOTE — Progress Notes (Signed)
Date:  03/23/2023   Name:  Justin Chen   DOB:  08-01-2000   MRN:  [id number]   Chief Complaint: Hypertension  HPI Justin Chen returns for 6-week follow-up on HTN having started HCTZ last visit.  Unfortunately, he did not realize he had a refill on the prescription and has been out of the medication for 2 weeks.  Otherwise he reports good compliance, tolerance, and efficacy of the medication. He was watching blood pressure at home while on the medication reporting numbers in the 120s systolic.  He has also been working on positive lifestyle changes to include more aerobic physical activity and less meat in the diet (has been on a strictly vegetarian diet for 1 week now).  He is down 7 pounds from last visit, BMI presently 22.3   Medication list has been reviewed and updated.  Current Meds  Medication Sig   [DISCONTINUED] hydrochlorothiazide (HYDRODIURIL) 25 MG tablet Take 0.5 tablets (12.5 mg total) by mouth daily for 7 days, THEN 1 tablet (25 mg total) daily.     Review of Systems  Patient Active Problem List   Diagnosis Date Noted   Primary hypertension 03/23/2023    No Known Allergies  Immunization History  Administered Date(s) Administered   Tdap 07/13/2019    Past Surgical History:  Procedure Laterality Date   CHEST TUBE INSERTION Right 07/14/2019   Procedure: Chest Tube Insertion;  Surgeon: Newman Pies, MD;  Location: [facility] OR;  Service: ENT;  Laterality: Right;   CLOSED REDUCTION MANDIBLE WITH MANDIBULOMA N/A 07/14/2019   Procedure: CLOSED REDUCTION MANDIBLE WITH MANDIBULOMAXILLARY FUSION;  Surgeon: Newman Pies, MD;  Location: [facility] OR;  Service: ENT;  Laterality: N/A;   MANDIBULAR HARDWARE REMOVAL Bilateral 08/19/2019   Procedure: MANDIBULAR HARDWARE REMOVAL;  Surgeon: Newman Pies, MD;  Location: [facility];  Service: ENT;  Laterality: Bilateral;    Social History   Tobacco Use   Smoking status: Never   Smokeless tobacco: Never  Vaping Use   Vaping status: Never  Used  Substance Use Topics   Alcohol use: Yes    Comment: ocassionally   Drug use: Never    Family History  Problem Relation Age of Onset   Diabetes Father    Obesity Father    Lung cancer Maternal Grandfather    Hypertension Paternal Grandfather         03/23/2023    8:20 AM 01/30/2023   10:52 AM  GAD 7 : Generalized Anxiety Score  Nervous, Anxious, on Edge 1 2  Control/stop worrying 0 0  Worry too much - different things 1 3  Trouble relaxing 0 0  Restless 1 2  Easily annoyed or irritable 0 0  Afraid - awful might happen 1 1  Total GAD 7 Score 4 8  Anxiety Difficulty Not difficult at all Not difficult at all       03/23/2023    8:20 AM 01/30/2023   10:52 AM  Depression screen PHQ 2/9  Decreased Interest 0 0  Down, Depressed, Hopeless 0 1  PHQ - 2 Score 0 1  Altered sleeping  0  Tired, decreased energy  0  Change in appetite  0  Feeling bad or failure about yourself   0  Trouble concentrating  0  Moving slowly or fidgety/restless  1  Suicidal thoughts  0  PHQ-9 Score  2  Difficult doing work/chores  Not difficult at all    BP Readings from Last 3 Encounters:  03/23/23 (!) 142/94  01/30/23 (!) 148/88  12/18/22 (!) 157/116    Wt Readings from Last 3 Encounters:  03/23/23 151 lb (68.5 kg)  01/30/23 158 lb (71.7 kg)  04/25/22 139 lb 15.9 oz (63.5 kg)    BP (!) 142/94 (BP Location: Left Arm, Patient Position: Sitting, Cuff Size: Normal)   Pulse 93   Temp 97.9 F (36.6 C)   Ht 5\' 9"  (1.753 m)   Wt 151 lb (68.5 kg)   SpO2 97%   BMI 22.30 kg/m   Physical Exam Vitals and nursing note reviewed.  Constitutional:      Appearance: Normal appearance.  Cardiovascular:     Rate and Rhythm: Normal rate.  Pulmonary:     Effort: Pulmonary effort is normal.  Abdominal:     General: There is no distension.  Musculoskeletal:        General: Normal range of motion.  Skin:    General: Skin is warm and dry.  Neurological:     Mental Status: He is alert and  oriented to person, place, and time.     Gait: Gait is intact.  Psychiatric:        Mood and Affect: Mood and affect normal.     Recent Labs     Component Value Date/Time   NA 138 07/15/2019 0612   K 3.7 07/15/2019 0612   CL 100 07/15/2019 0612   CO2 27 07/15/2019 0612   GLUCOSE 89 07/15/2019 0612   BUN 10 07/15/2019 0612   CREATININE 1.09 07/15/2019 0612   CALCIUM 9.4 07/15/2019 0612   PROT 7.1 07/13/2019 1715   ALBUMIN 4.3 07/13/2019 1715   AST 96 (H) 07/13/2019 1715   ALT 117 (H) 07/13/2019 1715   ALKPHOS 36 (L) 07/13/2019 1715   BILITOT 1.9 (H) 07/13/2019 1715   GFRNONAA >60 07/15/2019 0612   GFRAA >60 07/15/2019 0612    Lab Results  Component Value Date   WBC 9.1 07/15/2019   HGB 12.5 (L) 07/15/2019   HCT 37.9 (L) 07/15/2019   MCV 90.9 07/15/2019   PLT 155 07/15/2019   No results found for: "HGBA1C" No results found for: "CHOL", "HDL", "LDLCALC", "LDLDIRECT", "TRIG", "CHOLHDL" No results found for: "TSH"   Assessment and Plan:  1. Primary hypertension (Primary) Refilling HCTZ as below.  Reviewed the "Seven S's" of hypertension including salt, smoking, stimulants (e.g. caffeine), stress, sleep, snoring (OSA), sedentary lifestyle.  - hydrochlorothiazide (HYDRODIURIL) 25 MG tablet; Take 1 tablet (25 mg total) by mouth daily.  Dispense: 90 tablet; Refill: 1   Return in about 6 weeks (around 05/04/2023) for CPE.    Alvester Morin, PA-C, DMSc, Nutritionist [facility] Primary Care and Sports Medicine [facility] [phone]

## 2023-03-23 NOTE — Patient Instructions (Addendum)
-  It was a pleasure to see you today! Please review your visit summary for helpful information -I would encourage you to follow your care via MyChart where you can access lab results, notes, messages, and more -If you feel that we did a nice job today, please complete your after-visit survey and leave [place] a Google review! Your CMA today was Mariann Barter and your provider was Alvester Morin, PA-C, DMSc -Please return for follow-up in about 6 weeks

## 2023-07-31 ENCOUNTER — Encounter: Admitting: Physician Assistant

## 2023-08-31 ENCOUNTER — Ambulatory Visit (INDEPENDENT_AMBULATORY_CARE_PROVIDER_SITE_OTHER): Admitting: Physician Assistant

## 2023-08-31 VITALS — BP 136/88 | HR 75 | Temp 97.8°F | Ht 69.0 in | Wt 155.0 lb

## 2023-08-31 DIAGNOSIS — L609 Nail disorder, unspecified: Secondary | ICD-10-CM

## 2023-08-31 DIAGNOSIS — Z0000 Encounter for general adult medical examination without abnormal findings: Secondary | ICD-10-CM

## 2023-08-31 DIAGNOSIS — I10 Essential (primary) hypertension: Secondary | ICD-10-CM

## 2023-08-31 DIAGNOSIS — L603 Nail dystrophy: Secondary | ICD-10-CM

## 2023-08-31 DIAGNOSIS — H6123 Impacted cerumen, bilateral: Secondary | ICD-10-CM

## 2023-08-31 DIAGNOSIS — Z1159 Encounter for screening for other viral diseases: Secondary | ICD-10-CM

## 2023-08-31 NOTE — Progress Notes (Signed)
 Date:  08/31/2023   Name:  Justin Chen   DOB:  March 03, 2001   MRN:  [id number]   Chief Complaint: Hand Problem (X 2 weeks, Left hand thumb, looks like nail is coming off, not painful )  HPI Justin Chen returns to clinic overdue for follow-up, here today primarily to discuss dystrophic left thumbnail which he noticed about 2 weeks ago.  He is due for routine physical, which we will also complete today.  Reports good compliance with HCTZ for blood pressure, though he admits there is room for improvement in his nutrition habits.    Last Dental Exam: Mar 2025 Last Eye Exam: >2y ago    Medication list has been reviewed and updated.  Current Meds  Medication Sig   hydrochlorothiazide  (HYDRODIURIL ) 25 MG tablet Take 1 tablet (25 mg total) by mouth daily.     Review of Systems  Patient Active Problem List   Diagnosis Date Noted   Primary hypertension 03/23/2023    No Known Allergies  Immunization History  Administered Date(s) Administered   HPV 9-valent 11/22/2009, 10/02/2013   Hepatitis B, PED/ADOLESCENT 12/01/1998, 12/09/1998, 07/28/1999   Tdap 07/13/2019    Past Surgical History:  Procedure Laterality Date   CHEST TUBE INSERTION Right 07/14/2019   Procedure: Chest Tube Insertion;  Surgeon: Karis Clunes, MD;  Location: [facility] OR;  Service: ENT;  Laterality: Right;   CLOSED REDUCTION MANDIBLE WITH MANDIBULOMA N/A 07/14/2019   Procedure: CLOSED REDUCTION MANDIBLE WITH MANDIBULOMAXILLARY FUSION;  Surgeon: Karis Clunes, MD;  Location: [facility] OR;  Service: ENT;  Laterality: N/A;   MANDIBULAR HARDWARE REMOVAL Bilateral 08/19/2019   Procedure: MANDIBULAR HARDWARE REMOVAL;  Surgeon: Karis Clunes, MD;  Location: [facility];  Service: ENT;  Laterality: Bilateral;    Social History   Tobacco Use   Smoking status: Never   Smokeless tobacco: Never  Vaping Use   Vaping status: Never Used  Substance Use Topics   Alcohol use: Yes    Comment: ocassionally   Drug use: Never    Family  History  Problem Relation Age of Onset   Diabetes Father    Obesity Father    Lung cancer Maternal Grandfather    Hypertension Paternal Grandfather         08/31/2023    8:11 AM 03/23/2023    8:20 AM 01/30/2023   10:52 AM  GAD 7 : Generalized Anxiety Score  Nervous, Anxious, on Edge 1 1 2   Control/stop worrying 1 0 0  Worry too much - different things 3 1 3   Trouble relaxing 1 0 0  Restless 0 1 2  Easily annoyed or irritable 0 0 0  Afraid - awful might happen 0 1 1  Total GAD 7 Score 6 4 8   Anxiety Difficulty Not difficult at all Not difficult at all Not difficult at all       08/31/2023    8:10 AM 03/23/2023    8:20 AM 01/30/2023   10:52 AM  Depression screen PHQ 2/9  Decreased Interest 0 0 0  Down, Depressed, Hopeless 1 0 1  PHQ - 2 Score 1 0 1  Altered sleeping   0  Tired, decreased energy   0  Change in appetite   0  Feeling bad or failure about yourself    0  Trouble concentrating   0  Moving slowly or fidgety/restless   1  Suicidal thoughts   0  PHQ-9 Score   2  Difficult doing work/chores   Not  difficult at all    BP Readings from Last 3 Encounters:  08/31/23 136/88  03/23/23 (!) 142/94  01/30/23 (!) 148/88    Wt Readings from Last 3 Encounters:  08/31/23 155 lb (70.3 kg)  03/23/23 151 lb (68.5 kg)  01/30/23 158 lb (71.7 kg)    BP 136/88 (Cuff Size: Normal)   Pulse 75   Temp 97.8 F (36.6 C)   Ht 5' 9 (1.753 m)   Wt 155 lb (70.3 kg)   SpO2 98%   BMI 22.89 kg/m   Physical Exam Vitals and nursing note reviewed.  Constitutional:      Appearance: Normal appearance.  HENT:     Right Ear: There is impacted cerumen.     Left Ear: There is impacted cerumen.     Nose: Nose normal.     Mouth/Throat:     Mouth: Mucous membranes are moist. No oral lesions.     Dentition: Normal dentition.     Pharynx: No posterior oropharyngeal erythema.   Eyes:     Extraocular Movements: Extraocular movements intact.     Conjunctiva/sclera: Conjunctivae  normal.     Pupils: Pupils are equal, round, and reactive to light.   Neck:     Thyroid: No thyromegaly.   Cardiovascular:     Rate and Rhythm: Normal rate and regular rhythm.     Heart sounds: No murmur heard.    No friction rub. No gallop.     Comments: Pulses 2+ at radial, PT, DP bilaterally. No carotid bruit. No peripheral edema Pulmonary:     Effort: Pulmonary effort is normal.     Breath sounds: Normal breath sounds.  Abdominal:     General: Bowel sounds are normal.     Palpations: Abdomen is soft. There is no mass.     Tenderness: There is no abdominal tenderness.  Genitourinary:    Penis: Normal and circumcised.      Testes: Normal.        Right: Mass not present.        Left: Mass not present.   Musculoskeletal:     Comments: Full ROM with strength 5/5 bilateral upper and lower extremities  Feet:     Comments: Deeply pigmented left great toenail, possibly fungal disease, affecting other toenails to a lesser degree. Lymphadenopathy:     Cervical: No cervical adenopathy.   Skin:    General: Skin is warm.     Capillary Refill: Capillary refill takes less than 2 seconds.     Findings: No lesion or rash.     Comments: Horizontal dystrophy of proximal nail plate of left thumb as pictured below, nontender.   Neurological:     Mental Status: He is alert and oriented to person, place, and time.     Gait: Gait is intact.   Psychiatric:        Mood and Affect: Mood normal.        Behavior: Behavior normal.          Recent Labs     Component Value Date/Time   NA 138 07/15/2019 0612   K 3.7 07/15/2019 0612   CL 100 07/15/2019 0612   CO2 27 07/15/2019 0612   GLUCOSE 89 07/15/2019 0612   BUN 10 07/15/2019 0612   CREATININE 1.09 07/15/2019 0612   CALCIUM 9.4 07/15/2019 0612   PROT 7.1 07/13/2019 1715   ALBUMIN 4.3 07/13/2019 1715   AST 96 (H) 07/13/2019 1715   ALT 117 (H) 07/13/2019 1715  ALKPHOS 36 (L) 07/13/2019 1715   BILITOT 1.9 (H) 07/13/2019 1715    GFRNONAA >60 07/15/2019 0612   GFRAA >60 07/15/2019 0612    Lab Results  Component Value Date   WBC 9.1 07/15/2019   HGB 12.5 (L) 07/15/2019   HCT 37.9 (L) 07/15/2019   MCV 90.9 07/15/2019   PLT 155 07/15/2019   No results found for: HGBA1C No results found for: CHOL, HDL, LDLCALC, LDLDIRECT, TRIG, CHOLHDL No results found for: TSH   Assessment and Plan:  1. Annual physical exam (Primary) Seemingly healthy patient with minor abnormalities on exam. Encouraged healthy lifestyle including regular physical activity and consumption of whole fruits and vegetables. Encouraged routine dental and eye exams.  - CBC with Differential/Platelet - Comprehensive metabolic panel with GFR - TSH - Hepatitis C antibody  2. Primary hypertension Offered to increase pharmacotherapy but through shared decision making we decided to pursue lifestyle changes and continue with the current dose of HCTZ for now.  - CBC with Differential/Platelet - Comprehensive metabolic panel with GFR - TSH  3. Nail dystrophy of left thumb Suspect incidental trauma causing temporary dystrophy of the nail plate.  Patient states this is certainly within the realm of possibility as he works with a lot of boxes and may not have noticed a minor workplace injury.  4. Toenail abnormality of left great toe Could be onychomycosis, though it does have quite a deep color to it.  Refer to podiatry for second opinion and management as appropriate. - Ambulatory referral to Podiatry  5. Bilateral impacted cerumen Patient wears earbuds a lot at work likely contributing to impacted cerumen.  Did not irrigate today due to time constraints, but patient aware that we could do this at any time or he can try OTC irrigation kit.  6. Need for hepatitis C screening test - Hepatitis C antibody     Return in about 3 months (around 12/01/2023) for OV f/u HTN.    Rolan Hoyle, PA-C, DMSc, Nutritionist [facility] [phone]

## 2023-08-31 NOTE — Patient Instructions (Addendum)

## 2023-09-11 ENCOUNTER — Encounter: Payer: Self-pay | Admitting: Podiatry

## 2023-09-11 ENCOUNTER — Ambulatory Visit (INDEPENDENT_AMBULATORY_CARE_PROVIDER_SITE_OTHER): Admitting: Podiatry

## 2023-09-11 DIAGNOSIS — B351 Tinea unguium: Secondary | ICD-10-CM | POA: Diagnosis not present

## 2023-09-11 DIAGNOSIS — B353 Tinea pedis: Secondary | ICD-10-CM | POA: Diagnosis not present

## 2023-09-11 MED ORDER — KETOCONAZOLE 2 % EX CREA: 1.0000 | TOPICAL_CREAM | Freq: Every day | CUTANEOUS | 2 refills | Status: AC

## 2023-09-11 NOTE — Progress Notes (Signed)
  Subjective:  Patient ID: Justin Chen, male    DOB: 28-Jun-2000,   MRN: [id number]  Chief Complaint  Patient presents with   Nail Problem    Patient states that his left hallux , he has had discoloration to his toe nail for some years now. No pain nor drainage    23 y.o. male presents for concern of nail changes to bilateral feet. Relates these have been present for years.  Denies any current treatments.  Relates some itching and irritation of his feet. He has tried creams in the past.  Denies any other pedal complaints. Denies n/v/f/c.   Past Medical History:  Diagnosis Date   Anxiety 2021   Hypertension 2022    Objective:  Physical Exam: Vascular: DP/PT pulses 2/4 bilateral. CFT <3 seconds. Normal hair growth on digits. No edema.  Skin. No lacerations or abrasions bilateral feet. Left hallux nail thickened and dsytrophic and discolored with subugnudal debris. Mild scaling noted to plantar feet bilateral.  Musculoskeletal: MMT 5/5 bilateral lower extremities in DF, PF, Inversion and Eversion. Deceased ROM in DF of ankle joint.  Neurological: Sensation intact to light touch.   Assessment:   1. Onychomycosis   2. Tinea pedis of both feet      Plan:  Patient was evaluated and treated and all questions answered. -Examined patient -Discussed treatment options for painful dystrophic nails  -Clinical picture and Fungal culture was obtained by removing a portion of the hard nail itself from each of the involved toenails using a sterile nail nipper and sent to [facility] lab. Patient tolerated the biopsy procedure well without discomfort or need for anesthesia.  -Ketconazole sent for tina pedis  -Discussed fungal nail treatment options including oral, topical, and laser treatments.  -Patient to return in 4 weeks for follow up evaluation and discussion of fungal culture results or sooner if symptoms worsen.   Asberry Failing, DPM

## 2023-09-11 NOTE — Addendum Note (Signed)
 Addended by: BUREN LEWANDOWSKY D on: 09/11/2023 10:24 AM   Modules accepted: Orders

## 2023-10-15 ENCOUNTER — Other Ambulatory Visit: Payer: Self-pay | Admitting: Physician Assistant

## 2023-10-15 DIAGNOSIS — I10 Essential (primary) hypertension: Secondary | ICD-10-CM

## 2023-10-18 NOTE — Telephone Encounter (Signed)
 Requested medication (s) are due for refill today: yes  Requested medication (s) are on the active medication list: yes  Last refill:  03/23/23 #90 1 RF  Future visit scheduled: yes  Notes to clinic:  overdue lab work    Requested Prescriptions  Pending Prescriptions Disp Refills   hydrochlorothiazide (HYDRODIURIL) 25 MG tablet [Pharmacy Med Name: HYDROCHLOROTHIAZIDE 25 MG TAB] 90 tablet 1    Sig: TAKE 1 TABLET BY MOUTH EVERY DAY     Cardiovascular: Diuretics - Thiazide Failed - 10/18/2023  7:53 AM      Failed - Cr in normal range and within 180 days    Creatinine, Ser  Date Value Ref Range Status  07/15/2019 1.09 0.61 - 1.24 mg/dL Final         Failed - K in normal range and within 180 days    Potassium  Date Value Ref Range Status  07/15/2019 3.7 3.5 - 5.1 mmol/L Final         Failed - Na in normal range and within 180 days    Sodium  Date Value Ref Range Status  07/15/2019 138 135 - 145 mmol/L Final         Failed - Valid encounter within last 6 months    Recent Outpatient Visits           1 month ago Annual physical exam   [facility] at [facility], Toribio SQUIBB, [place]              Passed - Last BP in normal range    BP Readings from Last 1 Encounters:  08/31/23 136/88

## 2023-12-03 ENCOUNTER — Encounter: Payer: Self-pay | Admitting: Physician Assistant

## 2023-12-03 ENCOUNTER — Ambulatory Visit: Admitting: Physician Assistant

## 2023-12-30 ENCOUNTER — Emergency Department
Admission: EM | Admit: 2023-12-30 | Discharge: 2023-12-30 | Disposition: A | Discharge status: Home / Self Care | Payer: Worker's Compensation | Attending: Emergency Medicine | Admitting: Emergency Medicine

## 2023-12-30 DIAGNOSIS — S61012A Laceration without foreign body of left thumb without damage to nail, initial encounter: Secondary | ICD-10-CM | POA: Diagnosis not present

## 2023-12-30 DIAGNOSIS — W268XXA Contact with other sharp object(s), not elsewhere classified, initial encounter: Secondary | ICD-10-CM | POA: Insufficient documentation

## 2023-12-30 DIAGNOSIS — I10 Essential (primary) hypertension: Secondary | ICD-10-CM | POA: Insufficient documentation

## 2023-12-30 DIAGNOSIS — Y990 Civilian activity done for income or pay: Secondary | ICD-10-CM | POA: Insufficient documentation

## 2023-12-30 DIAGNOSIS — Z23 Encounter for immunization: Secondary | ICD-10-CM | POA: Insufficient documentation

## 2023-12-30 DIAGNOSIS — S6992XA Unspecified injury of left wrist, hand and finger(s), initial encounter: Secondary | ICD-10-CM | POA: Diagnosis present

## 2023-12-30 DIAGNOSIS — T148XXA Other injury of unspecified body region, initial encounter: Secondary | ICD-10-CM

## 2023-12-30 MED ORDER — TETANUS-DIPHTH-ACELL PERTUSSIS 5-2-15.5 LF-MCG/0.5 IM SUSP
0.5000 mL | Freq: Once | INTRAMUSCULAR | Status: AC
  Administered 2023-12-30: 0.5 mL via INTRAMUSCULAR
  Filled 2023-12-30: qty 0.5

## 2023-12-30 NOTE — Discharge Instructions (Signed)
 Keep wound dry for the next 24 hours.  Do not apply antibiotic ointment or lotions over the skin adhesive. Monitor closely for infection.  If you have any concerns please see primary care or return to the emergency department.

## 2023-12-30 NOTE — ED Provider Notes (Signed)
   [facility] Provider Note    Event Date/Time   First MD Initiated Contact with Patient 12/30/23 1318     (approximate)   History   Laceration   HPI  Justin Chen is a 23 y.o. male  with history of hypertension and as listed in EMR presents to the emergency department for treatment and evaluation after cutting his left thumb with a box cutter while at work.  bleeding well-controlled with a bandage that was applied right after injury.  Unsure when his last Tdap was.     Physical Exam    Vitals:   12/30/23 1302  BP: (!) 168/110  Pulse: 77  Resp: 18  Temp: 97.7 F (36.5 C)  SpO2: 98%    General: Awake, no distress.  CV:  Good peripheral perfusion.  Skin avulsion to the Resp:  Normal effort.  Abd:  No distention.  Other:  Skin avulsion noted to the edge of the left thumb no nail involvement.  Bleeding well-controlled   ED Results / Procedures / Treatments   Labs (all labs ordered are listed, but only abnormal results are displayed)  Labs Reviewed - No data to display   EKG     RADIOLOGY  Image and radiology report reviewed and interpreted by me. Radiology report consistent with the same.  Not indicated  PROCEDURES:  Critical Care performed: No  .Laceration Repair  Date/Time: 12/30/2023 6:15 PM  Performed by: Herlinda Kirk NOVAK, FNP Authorized by: Herlinda Kirk NOVAK, FNP   Consent:    Consent obtained:  Verbal   Consent given by:  Patient   Risks discussed:  Pain and infection Universal protocol:    Patient identity confirmed:  Verbally with patient Anesthesia:    Anesthesia method:  None Laceration details:    Location:  Finger   Finger location:  L thumb   Length (cm):  1 Treatment:    Area cleansed with:  Chlorhexidine and saline   Amount of cleaning:  Standard   Irrigation method:  Tap Skin repair:    Repair method:  Tissue adhesive Repair type:    Repair type:  Simple Post-procedure details:    Procedure  completion:  Tolerated well, no immediate complications    MEDICATIONS ORDERED IN ED:  Medications  Tdap (ADACEL) injection 0.5 mL (0.5 mLs Intramuscular Given 12/30/23 1419)     IMPRESSION / MDM / ASSESSMENT AND PLAN / ED COURSE   I have reviewed the triage note and vital signs. Vital signs stable   Differential diagnosis includes, but is not limited to, laceration, skin avulsion  Patient's presentation is most consistent with acute illness / injury with system symptoms.  23 year old male presenting to the emergency department for treatment and evaluation of laceration to the left thumb.  See HPI for further details.  Wound cleaned and repaired as described above.  Home care and outpatient follow-up discussed.      FINAL CLINICAL IMPRESSION(S) / ED DIAGNOSES   Final diagnoses:  Skin avulsion     Rx / DC Orders   ED Discharge Orders     None        Note:  This document was prepared using Dragon voice recognition software and may include unintentional dictation errors.   Herlinda Kirk NOVAK, FNP 12/30/23 1816    Arlander Charleston, MD 12/31/23 [phone]

## 2023-12-30 NOTE — ED Triage Notes (Signed)
 Pt presents to the ED via POV from home with laceration on left thumb. Pt reports cutting it with a box cutter at work. PMS intact. Pt able to move thumb. Bleeding controlled with bandage. Unknown last tetanus.

## 2023-12-31 ENCOUNTER — Telehealth: Payer: Self-pay

## 2023-12-31 NOTE — Transitions of Care (Post Inpatient/ED Visit) (Unsigned)
   12/31/2023  Name: Justin Chen MRN: [id number] DOB: 11-12-2000  Today's TOC FU Call Status: Today's TOC FU Call Status:: Unsuccessful Call (1st Attempt)  Attempted to reach the patient regarding the most recent Inpatient/ED visit.  Follow Up Plan: Additional outreach attempts will be made to reach the patient to complete the Transitions of Care (Post Inpatient/ED visit) call.   [employer], CMA

## 2024-01-01 NOTE — Transitions of Care (Post Inpatient/ED Visit) (Unsigned)
   01/01/2024  Name: Justin Chen MRN: [id number] DOB: 2000/05/24  Today's TOC FU Call Status: Today's TOC FU Call Status:: Unsuccessful Call (2nd Attempt) Unsuccessful Call (1st Attempt) Date: 12/31/23 Unsuccessful Call (2nd Attempt) Date: 01/01/24  Attempted to reach the patient regarding the most recent Inpatient/ED visit.  Follow Up Plan: Additional outreach attempts will be made to reach the patient to complete the Transitions of Care (Post Inpatient/ED visit) call.   [employer], CMA

## 2024-01-02 NOTE — Transitions of Care (Post Inpatient/ED Visit) (Signed)
   01/02/2024  Name: Justin Chen MRN: [id number] DOB: 30-Nov-2000  Today's TOC FU Call Status: Today's TOC FU Call Status:: Unsuccessful Call (3rd Attempt) Unsuccessful Call (1st Attempt) Date: 12/31/23 Unsuccessful Call (2nd Attempt) Date: 01/01/24 Unsuccessful Call (3rd Attempt) Date: 01/02/24  Attempted to reach the patient regarding the most recent Inpatient/ED visit.  Follow Up Plan: No further outreach attempts will be made at this time. We have been unable to contact the patient.  [employer], CMA

## 2024-09-02 ENCOUNTER — Encounter: Admitting: Physician Assistant
